# Patient Record
Sex: Male | Born: 1955 | Hispanic: No | Marital: Married | Smoking: Never smoker
Health system: Southern US, Community
[De-identification: ages and names within clinical notes are randomized; demographics above are authoritative.]

## PROBLEM LIST (undated history)

## (undated) DIAGNOSIS — E785 Hyperlipidemia, unspecified: Secondary | ICD-10-CM

## (undated) DIAGNOSIS — I10 Essential (primary) hypertension: Secondary | ICD-10-CM

## (undated) HISTORY — DX: Essential (primary) hypertension: I10

## (undated) HISTORY — DX: Hyperlipidemia, unspecified: E78.5

---

## 2002-01-01 HISTORY — PX: BACK SURGERY: SHX140

## 2002-02-22 ENCOUNTER — Emergency Department (HOSPITAL_COMMUNITY): Admission: EM | Admit: 2002-02-22 | Discharge: 2002-02-23 | Payer: Self-pay | Admitting: Emergency Medicine

## 2002-02-24 ENCOUNTER — Encounter: Payer: Self-pay | Admitting: Neurosurgery

## 2002-02-24 ENCOUNTER — Ambulatory Visit (HOSPITAL_COMMUNITY): Admission: RE | Admit: 2002-02-24 | Discharge: 2002-02-25 | Payer: Self-pay | Admitting: Neurosurgery

## 2006-11-21 ENCOUNTER — Ambulatory Visit: Payer: Self-pay | Admitting: Internal Medicine

## 2009-10-31 IMAGING — CT CT CHEST W/ CM
1 series · 16 of 32 positions shown, 20 images · non-contrast
Comparison: none

REASON FOR EXAM: abn chest xray
COMMENTS:

[Series 2: soft tissue · axial · 0.71mm/px · z∈[-668,-398]mm · 16 of 60 slices shown, 20 images]
[im 4/60  soft-tissue]
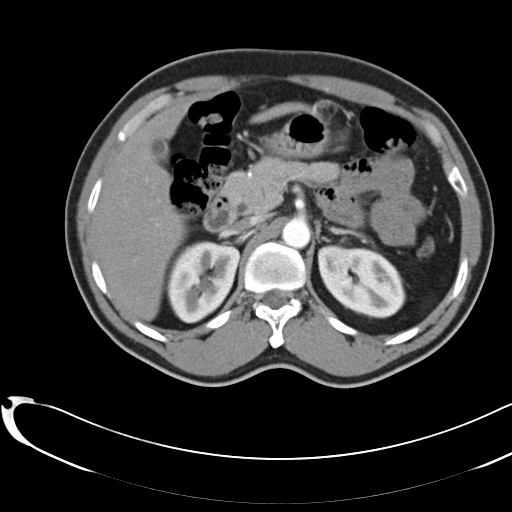
[im 4/60  bone]
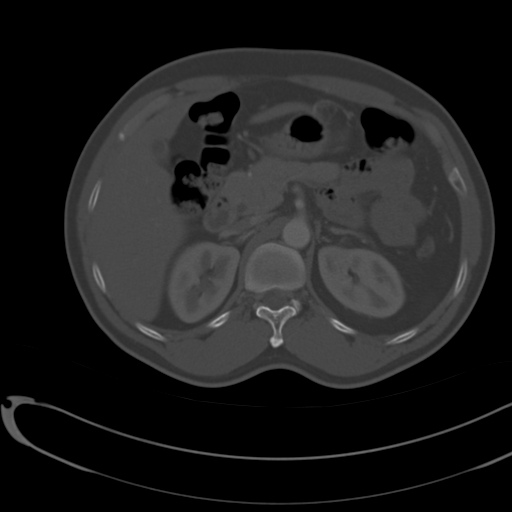
[im 8/60  soft-tissue]
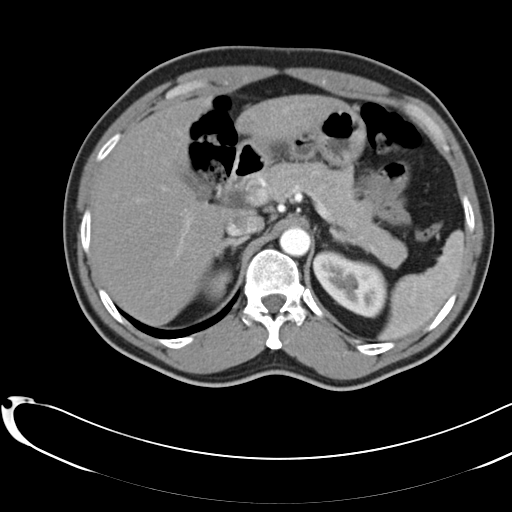
[im 12/60  soft-tissue]
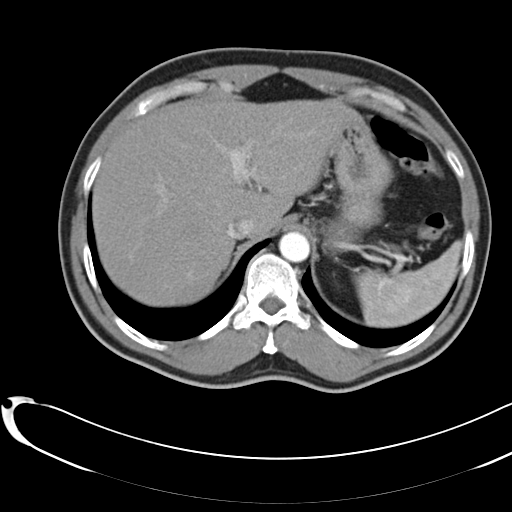
[im 16/60  soft-tissue]
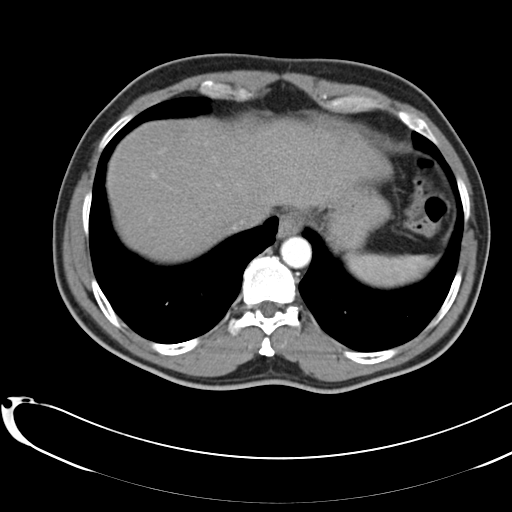
[im 20/60  soft-tissue]
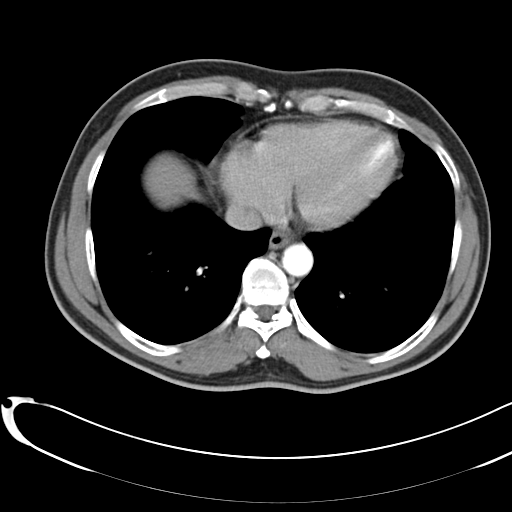
[im 23/60  soft-tissue]
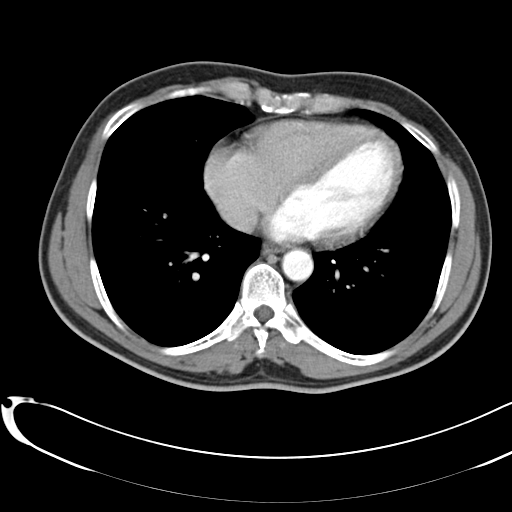
[im 27/60  soft-tissue]
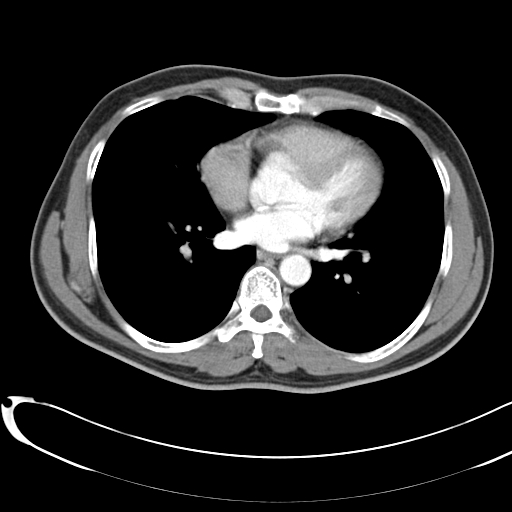
[im 33/60  soft-tissue]
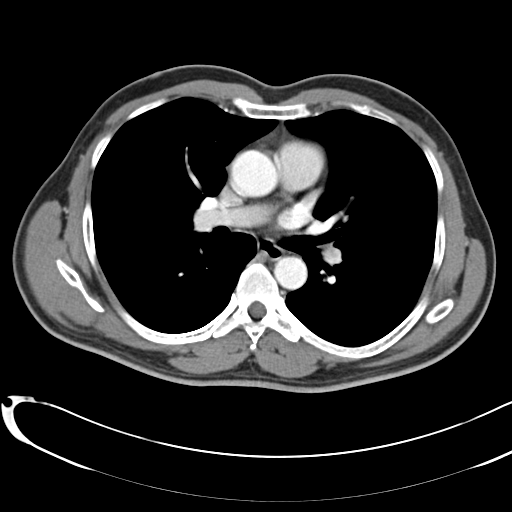
[im 37/60  soft-tissue]
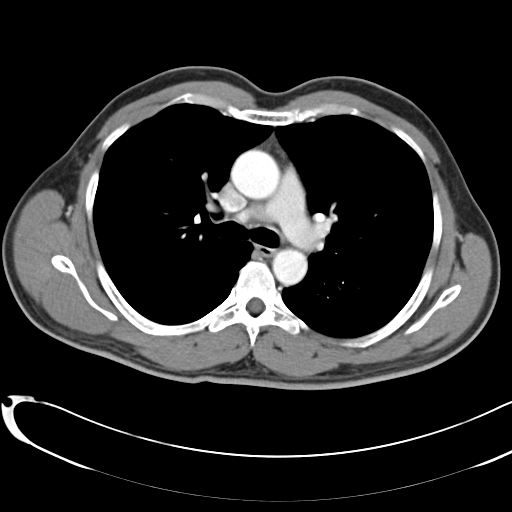
[im 37/60  bone]
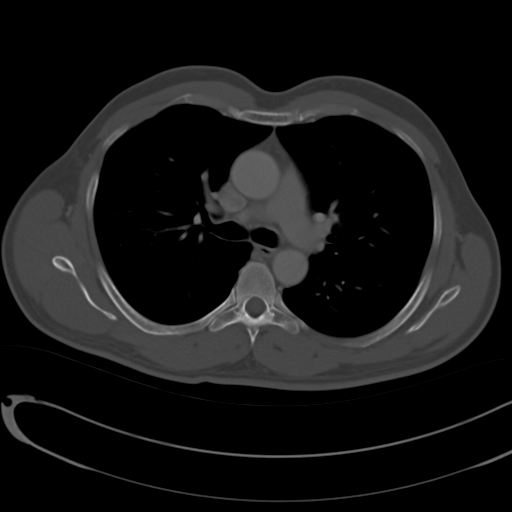
[im 40/60  soft-tissue]
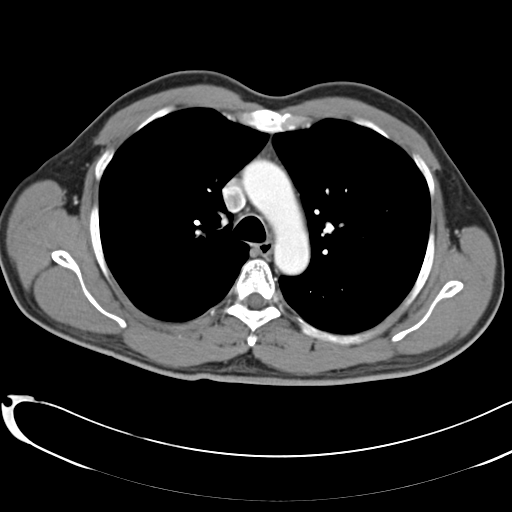
[im 44/60  soft-tissue]
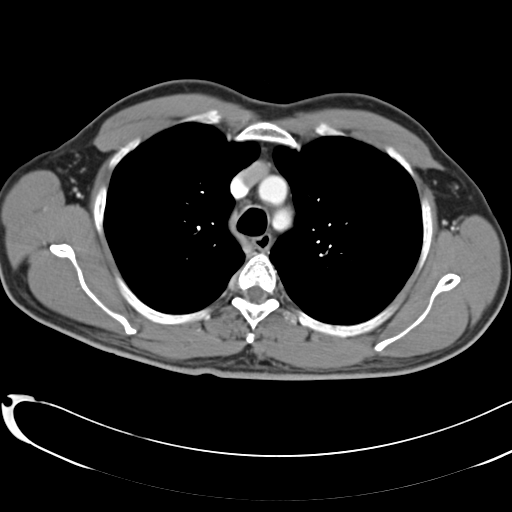
[im 48/60  soft-tissue]
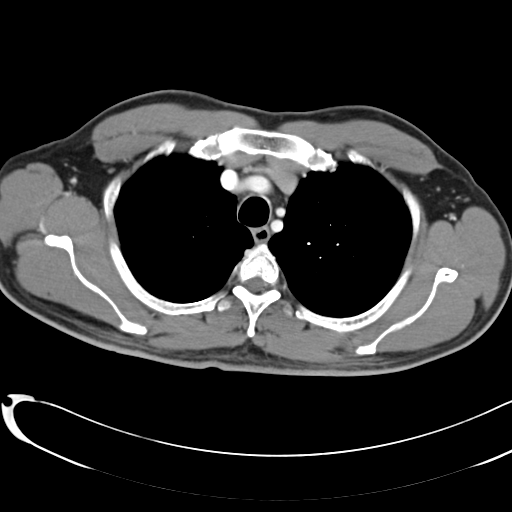
[im 52/60  soft-tissue]
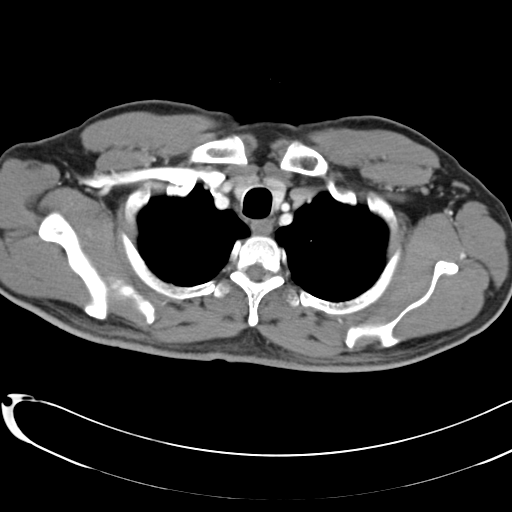
[im 52/60  lung]
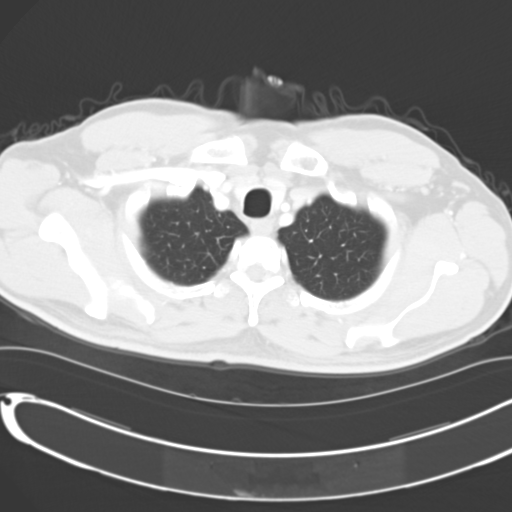
[im 54/60  lung]
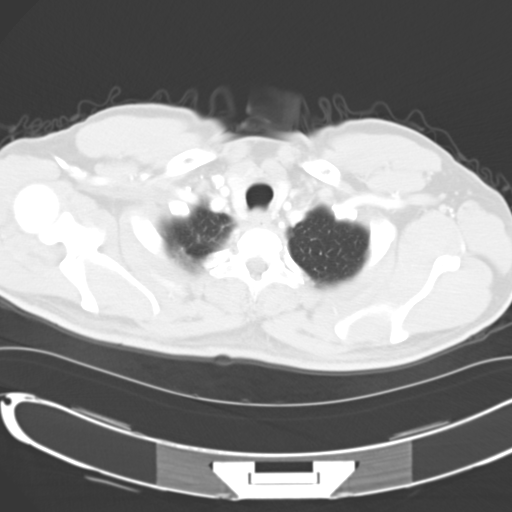
[im 56/60  soft-tissue]
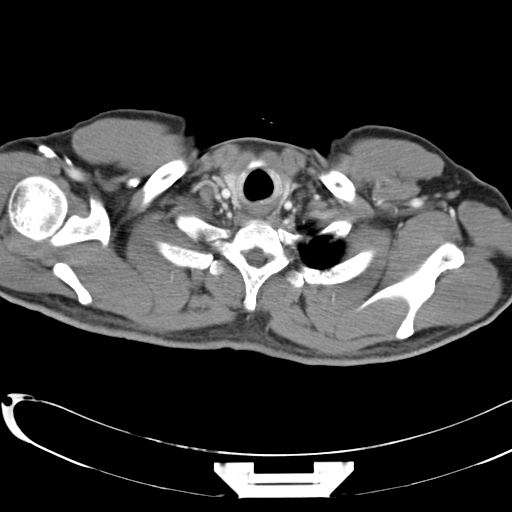
[im 56/60  lung]
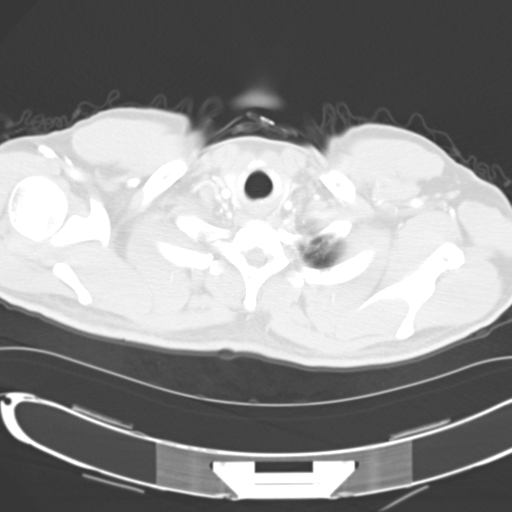
[im 58/60  lung]
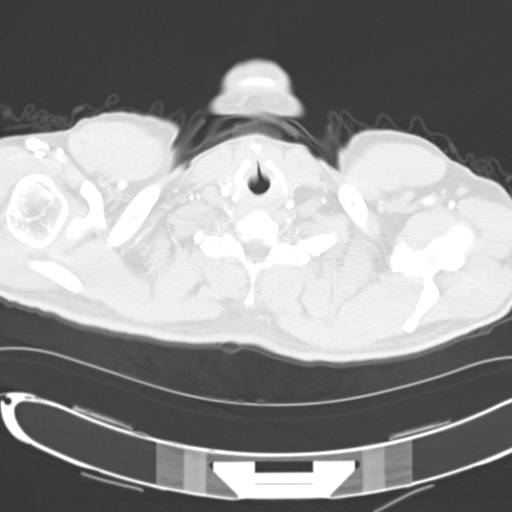

[16 of 32 positions shown; findings below may reference images not displayed]

PROCEDURE:     CT  - CT CHEST WITH CONTRAST  - November 21, 2006  [DATE]

RESULT:     Contrast-enhanced CT of the chest is performed utilizing 70 ml
of Wsovue-WJP iodinated contrast and a multislice helical acquisition
reconstructed at 5 mm slice thickness.

Lung window images demonstrate no focal infiltrate, mass or significant
emphysematous lung disease. There is no pulmonary edema, pleural effusion or
pericardial effusion. The thoracic aorta is normal in caliber with no
dissection evident. The heart appears to be normal in size. There is no
mediastinal, hilar or axillary mass or adenopathy. The upper abdominal
viscera included on this exam demonstrate no significant abnormality.
IMPRESSION: 1. No focal abnormality evident. No underlying pulmonary mass, adenopathy,
infiltrate or evidence of effusion.

## 2010-04-19 ENCOUNTER — Other Ambulatory Visit: Payer: Self-pay | Admitting: General Practice

## 2010-05-10 ENCOUNTER — Ambulatory Visit: Payer: Self-pay

## 2010-07-06 ENCOUNTER — Other Ambulatory Visit: Payer: Self-pay

## 2010-07-07 LAB — PSA: PSA: 1.2 ng/mL (ref 0.0–4.0)

## 2011-06-07 ENCOUNTER — Other Ambulatory Visit: Payer: Self-pay

## 2011-06-07 LAB — CBC WITH DIFFERENTIAL/PLATELET
Basophil #: 0 10*3/uL (ref 0.0–0.1)
Basophil %: 0.6 %
Eosinophil #: 0.1 10*3/uL (ref 0.0–0.7)
HGB: 15.5 g/dL (ref 13.0–18.0)
Lymphocyte #: 2.5 10*3/uL (ref 1.0–3.6)
Lymphocyte %: 36.6 %
MCH: 28.9 pg (ref 26.0–34.0)
MCV: 86 fL (ref 80–100)
Monocyte #: 0.7 x10 3/mm (ref 0.2–1.0)
Neutrophil %: 50.3 %
Platelet: 206 10*3/uL (ref 150–440)
RBC: 5.35 10*6/uL (ref 4.40–5.90)
RDW: 12.8 % (ref 11.5–14.5)
WBC: 6.9 10*3/uL (ref 3.8–10.6)

## 2011-06-07 LAB — LIPID PANEL
Cholesterol: 136 mg/dL (ref 0–200)
HDL Cholesterol: 32 mg/dL — ABNORMAL LOW (ref 40–60)
Ldl Cholesterol, Calc: 67 mg/dL (ref 0–100)
Triglycerides: 185 mg/dL (ref 0–200)
VLDL Cholesterol, Calc: 37 mg/dL (ref 5–40)

## 2011-06-07 LAB — COMPREHENSIVE METABOLIC PANEL
Albumin: 4.1 g/dL (ref 3.4–5.0)
Alkaline Phosphatase: 58 U/L (ref 50–136)
Calcium, Total: 8.9 mg/dL (ref 8.5–10.1)
Chloride: 104 mmol/L (ref 98–107)
Co2: 29 mmol/L (ref 21–32)
Creatinine: 1.07 mg/dL (ref 0.60–1.30)
EGFR (Non-African Amer.): >60
Osmolality: 279 (ref 275–301)
Potassium: 4.3 mmol/L (ref 3.5–5.1)
SGOT(AST): 47 U/L — ABNORMAL HIGH (ref 15–37)
SGPT (ALT): 45 U/L
Sodium: 139 mmol/L (ref 136–145)
Total Protein: 7.9 g/dL (ref 6.4–8.2)

## 2011-06-08 LAB — PSA: PSA: 1.2 ng/mL (ref 0.0–4.0)

## 2012-06-09 ENCOUNTER — Other Ambulatory Visit: Payer: Self-pay | Admitting: Physician Assistant

## 2012-06-09 LAB — COMPREHENSIVE METABOLIC PANEL
Albumin: 4.3 g/dL (ref 3.4–5.0)
Alkaline Phosphatase: 56 U/L (ref 50–136)
Anion Gap: 7 (ref 7–16)
BUN: 17 mg/dL (ref 7–18)
Bilirubin,Total: 0.5 mg/dL (ref 0.2–1.0)
Calcium, Total: 8.8 mg/dL (ref 8.5–10.1)
Chloride: 108 mmol/L — ABNORMAL HIGH (ref 98–107)
Co2: 25 mmol/L (ref 21–32)
Creatinine: 1.03 mg/dL (ref 0.60–1.30)
EGFR (African American): >60
EGFR (Non-African Amer.): >60
Glucose: 94 mg/dL (ref 65–99)
Osmolality: 281 (ref 275–301)
Potassium: 3.9 mmol/L (ref 3.5–5.1)
SGOT(AST): 43 U/L — ABNORMAL HIGH (ref 15–37)
SGPT (ALT): 40 U/L (ref 12–78)
Sodium: 140 mmol/L (ref 136–145)
Total Protein: 7.9 g/dL (ref 6.4–8.2)

## 2012-06-09 LAB — CBC WITH DIFFERENTIAL/PLATELET
Basophil #: 0 10*3/uL (ref 0.0–0.1)
Eosinophil #: 0.1 10*3/uL (ref 0.0–0.7)
HCT: 46.1 % (ref 40.0–52.0)
HGB: 16 g/dL (ref 13.0–18.0)
Lymphocyte #: 2.5 10*3/uL (ref 1.0–3.6)
Lymphocyte %: 38.2 %
MCH: 29.1 pg (ref 26.0–34.0)
MCHC: 34.6 g/dL (ref 32.0–36.0)
Neutrophil #: 3.2 10*3/uL (ref 1.4–6.5)
Platelet: 212 10*3/uL (ref 150–440)
RBC: 5.49 10*6/uL (ref 4.40–5.90)
RDW: 13 % (ref 11.5–14.5)

## 2012-06-09 LAB — LIPID PANEL
Cholesterol: 161 mg/dL (ref 0–200)
HDL Cholesterol: 43 mg/dL (ref 40–60)
Ldl Cholesterol, Calc: 88 mg/dL (ref 0–100)
Triglycerides: 152 mg/dL (ref 0–200)
VLDL Cholesterol, Calc: 30 mg/dL (ref 5–40)

## 2012-06-10 LAB — PSA: PSA: 1.3 ng/mL (ref 0.0–4.0)

## 2017-02-21 ENCOUNTER — Encounter: Payer: Self-pay | Admitting: Nurse Practitioner

## 2017-02-21 ENCOUNTER — Ambulatory Visit (INDEPENDENT_AMBULATORY_CARE_PROVIDER_SITE_OTHER): Payer: Managed Care, Other (non HMO) | Admitting: Nurse Practitioner

## 2017-02-21 VITALS — BP 132/88 | HR 91 | Resp 16 | Ht 69.0 in | Wt 150.0 lb

## 2017-02-21 DIAGNOSIS — Z0001 Encounter for general adult medical examination with abnormal findings: Secondary | ICD-10-CM | POA: Diagnosis not present

## 2017-02-21 DIAGNOSIS — M722 Plantar fascial fibromatosis: Secondary | ICD-10-CM

## 2017-02-21 DIAGNOSIS — E782 Mixed hyperlipidemia: Secondary | ICD-10-CM | POA: Insufficient documentation

## 2017-02-21 DIAGNOSIS — Z125 Encounter for screening for malignant neoplasm of prostate: Secondary | ICD-10-CM | POA: Diagnosis not present

## 2017-02-21 DIAGNOSIS — Z1211 Encounter for screening for malignant neoplasm of colon: Secondary | ICD-10-CM | POA: Diagnosis not present

## 2017-02-21 DIAGNOSIS — R300 Dysuria: Secondary | ICD-10-CM

## 2017-02-21 DIAGNOSIS — I10 Essential (primary) hypertension: Secondary | ICD-10-CM | POA: Diagnosis not present

## 2017-02-21 MED ORDER — IBUPROFEN 800 MG PO TABS: 800.0000 mg | ORAL_TABLET | Freq: Three times a day (TID) | ORAL | 1 refills | Status: DC | PRN

## 2017-02-21 MED ORDER — SIMVASTATIN 20 MG PO TABS: 20.0000 mg | ORAL_TABLET | Freq: Every day | ORAL | 11 refills | Status: DC

## 2017-02-21 MED ORDER — AMLODIPINE BESYLATE 5 MG PO TABS: 5.0000 mg | ORAL_TABLET | Freq: Every day | ORAL | 11 refills | Status: DC

## 2017-02-21 NOTE — Progress Notes (Signed)
[facility] [address] [place], [place] [id number]  Internal MEDICINE  Office Visit Note  Patient Name: Raymond Petersen  [id number]  [id number]  Date of Service: 03/02/2017  Chief Complaint  Patient presents with  . Foot Pain    left      Foot Pain  This is a new problem. The current episode started 1 to 4 weeks ago. The problem occurs intermittently. The problem has been gradually improving. Associated symptoms include arthralgias. Pertinent negatives include no abdominal pain, chest pain, chills, congestion, coughing, fatigue, joint swelling, nausea, neck pain, numbness, rash, sore throat or vomiting. The symptoms are aggravated by walking and standing. He has tried heat, ice, NSAIDs and rest for the symptoms. The treatment provided mild relief.   Pt is here for routine health maintenance examination  Current Medication: Outpatient Encounter Medications as of 02/21/2017  Medication Sig  . amLODipine (NORVASC) 5 MG tablet Take 1 tablet (5 mg total) by mouth daily.  Marland Kitchen. ibuprofen (ADVIL,MOTRIN) 800 MG tablet Take 1 tablet (800 mg total) by mouth every 8 (eight) hours as needed for moderate pain.  . simvastatin (ZOCOR) 20 MG tablet Take 1 tablet (20 mg total) by mouth daily at 6 PM.  . [DISCONTINUED] amLODipine (NORVASC) 5 MG tablet TK 1 T PO ONCE D  . [DISCONTINUED] simvastatin (ZOCOR) 20 MG tablet TK 1 T PO Q NIGHT FOR CHOLESTEROL   No facility-administered encounter medications on file as of 02/21/2017.     Surgical History: Past Surgical History:  Procedure Laterality Date  . BACK SURGERY  2004    Medical History: Past Medical History:  Diagnosis Date  . Hyperlipidemia   . Hypertension     Family History: Family History  Problem Relation Age of Onset  . Diabetes Mother       Review of Systems  Constitutional: Negative for activity change, chills, fatigue and unexpected weight change.  HENT: Negative for congestion, postnasal drip, rhinorrhea,  sneezing and sore throat.   Eyes: Negative.  Negative for redness.  Respiratory: Negative for apnea, cough, chest tightness and shortness of breath.   Cardiovascular: Negative for chest pain and palpitations.  Gastrointestinal: Negative for abdominal pain, constipation, diarrhea, nausea and vomiting.  Endocrine: Negative for cold intolerance, heat intolerance, polydipsia, polyphagia and polyuria.  Genitourinary: Negative.  Negative for dysuria and frequency.  Musculoskeletal: Positive for arthralgias. Negative for back pain, joint swelling and neck pain.       Left foot pain along the inner arch, just below the heel. Some swelling has been noted. Hurts more when standing up, then gradually improves. Has been taking OTC ibuprofen and using heat and ice. This has helped some as well.   Skin: Negative for rash.  Allergic/Immunologic: Negative for environmental allergies.  Neurological: Negative.  Negative for tremors and numbness.  Hematological: Negative for adenopathy. Does not bruise/bleed easily.  Psychiatric/Behavioral: Negative for agitation, behavioral problems (Depression), sleep disturbance and suicidal ideas. The patient is not nervous/anxious.      Today's Vitals   02/21/17 1034 02/21/17 1123  BP: (!) 150/94 132/88  Pulse: 91   Resp: 16   SpO2: 94%   Weight: 150 lb (68 kg)   Height: 5\' 9"  (1.753 m)     Physical Exam  Constitutional: He is oriented to person, place, and time. He appears well-developed and well-nourished. No distress.  HENT:  Head: Normocephalic and atraumatic.  Mouth/Throat: Oropharynx is clear and moist. No oropharyngeal exudate.  Eyes: EOM are normal. Pupils are  equal, round, and reactive to light.  Neck: Normal range of motion. Neck supple. No JVD present. No tracheal deviation present. No thyromegaly present.  Cardiovascular: Normal rate, regular rhythm and normal heart sounds. Exam reveals no gallop and no friction rub.  No murmur  heard. Pulmonary/Chest: Effort normal. No respiratory distress. He has no wheezes. He has no rales. He exhibits no tenderness.  Abdominal: Soft. Bowel sounds are normal.  Musculoskeletal: Normal range of motion.  Lymphadenopathy:    He has no cervical adenopathy.  Neurological: He is alert and oriented to person, place, and time. No cranial nerve deficit.  Skin: Skin is warm and dry. He is not diaphoretic.  Psychiatric: He has a normal mood and affect. His behavior is normal. Judgment and thought content normal.  Nursing note and vitals reviewed.   Assessment/Plan: 1. Encounter for general adult medical examination with abnormal findings Annual wellness visit today. - CBC with Differential/Platelet - Comprehensive metabolic panel - TSH  2. Essential hypertension, malignant Stable. Continue bp medication as prescribed. - CBC with Differential/Platelet - Comprehensive metabolic panel - amLODipine (NORVASC) 5 MG tablet; Take 1 tablet (5 mg total) by mouth daily.  Dispense: 30 tablet; Refill: 11  3. Mixed hyperlipidemia - Lipid panel - simvastatin (ZOCOR) 20 MG tablet; Take 1 tablet (20 mg total) by mouth daily at 6 PM.  Dispense: 30 tablet; Refill: 11  4. Plantar fasciitis - ibuprofen (ADVIL,MOTRIN) 800 MG tablet; Take 1 tablet (800 mg total) by mouth every 8 (eight) hours as needed for moderate pain.  Dispense: 90 tablet; Refill: 1 Discussed stretching exercises to help relieve plantar fasciitis. Will discuss refferral to podiatry if no improvement in reasonable time.   5. Dysuria - Urinalysis, Routine w reflex microscopic  6. Screening for prostate cancer - PSA  7. Screening for colon cancer - Fecal occult blood, imunochemical  General Counseling: [name] verbalizes understanding of the findings of todays visit and agrees with plan of treatment. I have discussed any further diagnostic evaluation that may be needed or ordered today. We also reviewed his medications today. he  has been encouraged to call the office with any questions or concerns that should arise related to todays visit.  This patient was seen by Vincent Gros, FNP- C in Collaboration with Dr Lyndon Code as a part of collaborative care agreement    Orders Placed This Encounter  Procedures  . Fecal occult blood, imunochemical  . Urinalysis, Routine w reflex microscopic  . PSA  . CBC with Differential/Platelet  . Comprehensive metabolic panel  . Lipid panel  . TSH    Meds ordered this encounter  Medications  . amLODipine (NORVASC) 5 MG tablet    Sig: Take 1 tablet (5 mg total) by mouth daily.    Dispense:  30 tablet    Refill:  11    Order Specific Question:   Supervising Provider    Answer:   Lyndon Code [[id number]]  . simvastatin (ZOCOR) 20 MG tablet    Sig: Take 1 tablet (20 mg total) by mouth daily at 6 PM.    Dispense:  30 tablet    Refill:  11    Order Specific Question:   Supervising Provider    Answer:   Lyndon Code [[id number]]  . ibuprofen (ADVIL,MOTRIN) 800 MG tablet    Sig: Take 1 tablet (800 mg total) by mouth every 8 (eight) hours as needed for moderate pain.    Dispense:  90 tablet    Refill:  1    Order Specific Question:   Supervising Provider    Answer:   Lyndon Code [[id number]]    Time spent: 13 Minutes    Lyndon Code, MD  Internal Medicine

## 2017-02-22 LAB — URINALYSIS, ROUTINE W REFLEX MICROSCOPIC
Bilirubin, UA: NEGATIVE
Glucose, UA: NEGATIVE
Ketones, UA: NEGATIVE
Leukocytes, UA: NEGATIVE
Nitrite, UA: NEGATIVE
PH UA: 7 (ref 5.0–7.5)
Protein, UA: NEGATIVE
RBC UA: NEGATIVE
Specific Gravity, UA: 1.02 (ref 1.005–1.030)
Urobilinogen, Ur: 0.2 mg/dL (ref 0.2–1.0)

## 2017-02-23 LAB — COMPREHENSIVE METABOLIC PANEL
ALT: 32 IU/L (ref 0–44)
AST: 29 IU/L (ref 0–40)
Albumin/Globulin Ratio: 1.7 (ref 1.2–2.2)
Albumin: 4.9 g/dL (ref 3.5–5.5)
Alkaline Phosphatase: 57 IU/L (ref 39–117)
BUN/Creatinine Ratio: 17 (ref 9–20)
BUN: 19 mg/dL (ref 6–24)
Bilirubin Total: 0.5 mg/dL (ref 0.0–1.2)
CHLORIDE: 103 mmol/L (ref 96–106)
CO2: 23 mmol/L (ref 20–29)
CREATININE: 1.13 mg/dL (ref 0.76–1.27)
Calcium: 9.7 mg/dL (ref 8.7–10.2)
GFR calc Af Amer: 82 mL/min/{1.73_m2} (ref >59)
GFR calc non Af Amer: 71 mL/min/{1.73_m2} (ref >59)
GLUCOSE: 102 mg/dL — AB (ref 65–99)
Globulin, Total: 2.9 g/dL (ref 1.5–4.5)
Potassium: 4.6 mmol/L (ref 3.5–5.2)
Sodium: 140 mmol/L (ref 134–144)
TOTAL PROTEIN: 7.8 g/dL (ref 6.0–8.5)

## 2017-02-23 LAB — CBC WITH DIFFERENTIAL/PLATELET
Basophils Absolute: 0 10*3/uL (ref 0.0–0.2)
Basos: 0 %
EOS (ABSOLUTE): 0.1 10*3/uL (ref 0.0–0.4)
Eos: 2 %
Hematocrit: 47.2 % (ref 37.5–51.0)
Hemoglobin: 16.5 g/dL (ref 13.0–17.7)
Immature Grans (Abs): 0 10*3/uL (ref 0.0–0.1)
Immature Granulocytes: 0 %
LYMPHS: 44 %
Lymphocytes Absolute: 3.3 10*3/uL — ABNORMAL HIGH (ref 0.7–3.1)
MCH: 29.8 pg (ref 26.6–33.0)
MCHC: 35 g/dL (ref 31.5–35.7)
MCV: 85 fL (ref 79–97)
MONOCYTES: 8 %
Monocytes Absolute: 0.6 10*3/uL (ref 0.1–0.9)
Neutrophils Absolute: 3.5 10*3/uL (ref 1.4–7.0)
Neutrophils: 46 %
PLATELETS: 223 10*3/uL (ref 150–379)
RBC: 5.54 x10E6/uL (ref 4.14–5.80)
RDW: 13.4 % (ref 12.3–15.4)
WBC: 7.6 10*3/uL (ref 3.4–10.8)

## 2017-02-23 LAB — LIPID PANEL
Chol/HDL Ratio: 3.7 ratio (ref 0.0–5.0)
Cholesterol, Total: 157 mg/dL (ref 100–199)
HDL: 42 mg/dL (ref >39)
LDL CALC: 82 mg/dL (ref 0–99)
Triglycerides: 167 mg/dL — ABNORMAL HIGH (ref 0–149)
VLDL CHOLESTEROL CAL: 33 mg/dL (ref 5–40)

## 2017-02-23 LAB — PSA: PROSTATE SPECIFIC AG, SERUM: 1 ng/mL (ref 0.0–4.0)

## 2017-02-23 LAB — TSH: TSH: 2.94 u[IU]/mL (ref 0.450–4.500)

## 2017-02-26 ENCOUNTER — Emergency Department: Payer: No Typology Code available for payment source

## 2017-02-26 ENCOUNTER — Emergency Department
Admission: EM | Admit: 2017-02-26 | Discharge: 2017-02-26 | Disposition: A | Discharge status: Home / Self Care | Payer: No Typology Code available for payment source | Attending: Emergency Medicine | Admitting: Emergency Medicine

## 2017-02-26 ENCOUNTER — Other Ambulatory Visit: Payer: Self-pay

## 2017-02-26 ENCOUNTER — Telehealth: Payer: Self-pay

## 2017-02-26 ENCOUNTER — Encounter: Payer: Self-pay | Admitting: Emergency Medicine

## 2017-02-26 DIAGNOSIS — Y9355 Activity, bike riding: Secondary | ICD-10-CM | POA: Diagnosis not present

## 2017-02-26 DIAGNOSIS — Y929 Unspecified place or not applicable: Secondary | ICD-10-CM | POA: Insufficient documentation

## 2017-02-26 DIAGNOSIS — S2242XA Multiple fractures of ribs, left side, initial encounter for closed fracture: Secondary | ICD-10-CM | POA: Diagnosis not present

## 2017-02-26 DIAGNOSIS — Y999 Unspecified external cause status: Secondary | ICD-10-CM | POA: Insufficient documentation

## 2017-02-26 DIAGNOSIS — I10 Essential (primary) hypertension: Secondary | ICD-10-CM | POA: Diagnosis not present

## 2017-02-26 DIAGNOSIS — S299XXA Unspecified injury of thorax, initial encounter: Secondary | ICD-10-CM | POA: Diagnosis present

## 2017-02-26 DIAGNOSIS — T1490XA Injury, unspecified, initial encounter: Secondary | ICD-10-CM

## 2017-02-26 MED ORDER — HYDROCODONE-ACETAMINOPHEN 5-325 MG PO TABS: 1.0000 | ORAL_TABLET | ORAL | 0 refills | Status: DC | PRN

## 2017-02-26 MED ORDER — MELOXICAM 15 MG PO TABS: 15.0000 mg | ORAL_TABLET | Freq: Every day | ORAL | 0 refills | Status: DC

## 2017-02-26 MED ORDER — OXYCODONE-ACETAMINOPHEN 5-325 MG PO TABS
1.0000 | ORAL_TABLET | Freq: Once | ORAL | Status: AC
  Administered 2017-02-26: 1 via ORAL
  Filled 2017-02-26: qty 1

## 2017-02-26 NOTE — Telephone Encounter (Signed)
Left vm that labs all good.  dbs
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
## 2017-03-02 DIAGNOSIS — R300 Dysuria: Secondary | ICD-10-CM | POA: Insufficient documentation

## 2017-03-02 DIAGNOSIS — M722 Plantar fascial fibromatosis: Secondary | ICD-10-CM | POA: Insufficient documentation

## 2017-03-04 ENCOUNTER — Telehealth: Payer: Self-pay

## 2017-03-04 ENCOUNTER — Ambulatory Visit: Payer: Managed Care, Other (non HMO) | Admitting: Nurse Practitioner
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
## 2018-01-13 ENCOUNTER — Other Ambulatory Visit: Payer: Self-pay

## 2018-01-13 DIAGNOSIS — E782 Mixed hyperlipidemia: Secondary | ICD-10-CM

## 2018-01-13 MED ORDER — SIMVASTATIN 20 MG PO TABS
20.0000 mg | ORAL_TABLET | Freq: Every day | ORAL | 2 refills | Status: DC
Start: 2018-01-13 — End: 2018-02-24

## 2018-02-24 ENCOUNTER — Ambulatory Visit: Payer: BLUE CROSS/BLUE SHIELD | Admitting: Nurse Practitioner

## 2018-02-24 ENCOUNTER — Other Ambulatory Visit: Payer: Self-pay | Admitting: Nurse Practitioner

## 2018-02-24 VITALS — BP 142/92 | HR 86 | Resp 16 | Ht 70.0 in | Wt 150.0 lb

## 2018-02-24 DIAGNOSIS — I10 Essential (primary) hypertension: Secondary | ICD-10-CM | POA: Diagnosis not present

## 2018-02-24 DIAGNOSIS — E782 Mixed hyperlipidemia: Secondary | ICD-10-CM | POA: Diagnosis not present

## 2018-02-24 DIAGNOSIS — R300 Dysuria: Secondary | ICD-10-CM | POA: Diagnosis not present

## 2018-02-24 DIAGNOSIS — Z0001 Encounter for general adult medical examination with abnormal findings: Secondary | ICD-10-CM

## 2018-02-24 MED ORDER — SIMVASTATIN 20 MG PO TABS: 20.0000 mg | ORAL_TABLET | Freq: Every day | ORAL | 11 refills | Status: DC

## 2018-02-24 MED ORDER — AMLODIPINE BESYLATE 5 MG PO TABS: 5.0000 mg | ORAL_TABLET | Freq: Every day | ORAL | 11 refills | Status: DC

## 2018-02-24 NOTE — Progress Notes (Signed)
[facility] [address] [place], [place] [address]  Internal MEDICINE  Office Visit Note  Patient Name: Raymond Petersen  [id number]  [id number]  Date of Service: 02/24/2018   Pt is here for routine health maintenance examination   Chief Complaint  Patient presents with  . Annual Exam  . Hypertension     Hypertension  This is a chronic problem. The current episode started more than 1 year ago. The problem is controlled. Pertinent negatives include no chest pain, headaches, neck pain, palpitations or shortness of breath. There are no associated agents to hypertension. Risk factors for coronary artery disease include dyslipidemia and male gender. Past treatments include calcium channel blockers. The current treatment provides moderate improvement. There are no compliance problems.     Current Medication: Outpatient Encounter Medications as of 02/24/2018  Medication Sig  . amLODipine (NORVASC) 5 MG tablet Take 1 tablet (5 mg total) by mouth daily.  Marland Kitchen HYDROcodone-acetaminophen (NORCO/VICODIN) 5-325 MG tablet Take 1 tablet by mouth every 4 (four) hours as needed for moderate pain.  Marland Kitchen ibuprofen (ADVIL,MOTRIN) 800 MG tablet Take 1 tablet (800 mg total) by mouth every 8 (eight) hours as needed for moderate pain.  . meloxicam (MOBIC) 15 MG tablet Take 1 tablet (15 mg total) by mouth daily.  . simvastatin (ZOCOR) 20 MG tablet Take 1 tablet (20 mg total) by mouth daily at 6 PM.  . [DISCONTINUED] amLODipine (NORVASC) 5 MG tablet Take 1 tablet (5 mg total) by mouth daily.  . [DISCONTINUED] simvastatin (ZOCOR) 20 MG tablet Take 1 tablet (20 mg total) by mouth daily at 6 PM.   No facility-administered encounter medications on file as of 02/24/2018.     Surgical History: Past Surgical History:  Procedure Laterality Date  . BACK SURGERY  2004    Medical History: Past Medical History:  Diagnosis Date  . Hyperlipidemia   . Hypertension     Family History: Family  History  Problem Relation Age of Onset  . Diabetes Mother       Review of Systems  Constitutional: Negative for activity change, chills, fatigue and unexpected weight change.  HENT: Negative for congestion, postnasal drip, rhinorrhea, sneezing and sore throat.   Respiratory: Negative for apnea, cough, chest tightness and shortness of breath.   Cardiovascular: Negative for chest pain and palpitations.  Gastrointestinal: Negative for abdominal pain, constipation, diarrhea, nausea and vomiting.  Endocrine: Negative for cold intolerance, heat intolerance, polydipsia and polyuria.  Genitourinary: Negative.   Musculoskeletal: Negative for back pain, joint swelling and neck pain.  Skin: Negative for rash.  Allergic/Immunologic: Negative for environmental allergies.  Neurological: Negative for dizziness, tremors, numbness and headaches.  Hematological: Negative for adenopathy. Does not bruise/bleed easily.  Psychiatric/Behavioral: Negative for agitation, behavioral problems (Depression), sleep disturbance and suicidal ideas. The patient is not nervous/anxious.      Today's Vitals   02/24/18 1337  BP: (!) 142/92  Pulse: 86  Resp: 16  SpO2: 99%  Weight: 150 lb (68 kg)  Height: 5\' 10"  (1.778 m)   Body mass index is 21.52 kg/m.  Physical Exam Vitals signs and nursing note reviewed.  Constitutional:      General: He is not in acute distress.    Appearance: Normal appearance. He is well-developed. He is not diaphoretic.  HENT:     Head: Normocephalic and atraumatic.     Nose: Nose normal.     Mouth/Throat:     Mouth: Mucous membranes are moist.     Pharynx:  Oropharynx is clear. No oropharyngeal exudate.  Eyes:     Extraocular Movements: Extraocular movements intact.     Conjunctiva/sclera: Conjunctivae normal.     Pupils: Pupils are equal, round, and reactive to light.  Neck:     Musculoskeletal: Normal range of motion and neck supple.     Thyroid: No thyromegaly.      Vascular: No carotid bruit or JVD.     Trachea: No tracheal deviation.  Cardiovascular:     Rate and Rhythm: Normal rate and regular rhythm.     Pulses: Normal pulses.     Heart sounds: Normal heart sounds. No murmur. No friction rub. No gallop.   Pulmonary:     Effort: Pulmonary effort is normal. No respiratory distress.     Breath sounds: Normal breath sounds. No wheezing or rales.  Chest:     Chest wall: No tenderness.  Abdominal:     General: Bowel sounds are normal.     Palpations: Abdomen is soft.  Musculoskeletal: Normal range of motion.  Lymphadenopathy:     Cervical: No cervical adenopathy.  Skin:    General: Skin is warm and dry.  Neurological:     Mental Status: He is alert and oriented to person, place, and time.     Cranial Nerves: No cranial nerve deficit.  Psychiatric:        Behavior: Behavior normal.        Thought Content: Thought content normal.        Judgment: Judgment normal.   Assessment/Plan: 1. Encounter for general adult medical examination with abnormal findings Annual health maintenance exam today.   2. Essential hypertension, malignant Stable. Continue blood pressure medication as precribed refills provided today.  - amLODipine (NORVASC) 5 MG tablet; Take 1 tablet (5 mg total) by mouth daily.  Dispense: 30 tablet; Refill: 11  3. Mixed hyperlipidemia Continue simvastatin as prescribed.  - simvastatin (ZOCOR) 20 MG tablet; Take 1 tablet (20 mg total) by mouth daily at 6 PM.  Dispense: 30 tablet; Refill: 11  4. Dysuria - UA/M w/rflx Culture, Routine  General Counseling: davion hoshino understanding of the findings of todays visit and agrees with plan of treatment. I have discussed any further diagnostic evaluation that may be needed or ordered today. We also reviewed his medications today. he has been encouraged to call the office with any questions or concerns that should arise related to todays visit.    Counseling:  Hypertension  Counseling:   The following hypertensive lifestyle modification were recommended and discussed:  1. Limiting alcohol intake to less than 1 oz/day of ethanol:(24 oz of beer or 8 oz of wine or 2 oz of 100-proof whiskey). 2. Take baby ASA 81 mg daily. 3. Importance of regular aerobic exercise and losing weight. 4. Reduce dietary saturated fat and cholesterol intake for overall cardiovascular health. 5. Maintaining adequate dietary potassium, calcium, and magnesium intake. 6. Regular monitoring of the blood pressure. 7. Reduce sodium intake to less than 100 mmol/day (less than 2.3 gm of sodium or less than 6 gm of sodium choride)   Diflucan may be taken as needed and as prescribed if yeast infection develops.  Orders Placed This Encounter  Procedures  . UA/M w/rflx Culture, Routine    Meds ordered this encounter  Medications  . amLODipine (NORVASC) 5 MG tablet    Sig: Take 1 tablet (5 mg total) by mouth daily.    Dispense:  30 tablet    Refill:  11  Order Specific Question:   Supervising Provider    Answer:   Lyndon Code [[id number]]  . simvastatin (ZOCOR) 20 MG tablet    Sig: Take 1 tablet (20 mg total) by mouth daily at 6 PM.    Dispense:  30 tablet    Refill:  11    Order Specific Question:   Supervising Provider    Answer:   Lyndon Code [[id number]]    Time spent: 91 Minutes      Lyndon Code, MD  Internal Medicine

## 2018-02-25 LAB — CBC
Hematocrit: 48.6 % (ref 37.5–51.0)
Hemoglobin: 16.7 g/dL (ref 13.0–17.7)
MCH: 29.7 pg (ref 26.6–33.0)
MCHC: 34.4 g/dL (ref 31.5–35.7)
MCV: 87 fL (ref 79–97)
Platelets: 245 10*3/uL (ref 150–450)
RBC: 5.62 x10E6/uL (ref 4.14–5.80)
RDW: 13.1 % (ref 11.6–15.4)
WBC: 6.8 10*3/uL (ref 3.4–10.8)

## 2018-02-25 LAB — UA/M W/RFLX CULTURE, ROUTINE
Bilirubin, UA: NEGATIVE
Glucose, UA: NEGATIVE
Ketones, UA: NEGATIVE
Leukocytes, UA: NEGATIVE
Nitrite, UA: NEGATIVE
PH UA: 7 (ref 5.0–7.5)
Protein, UA: NEGATIVE
RBC, UA: NEGATIVE
Specific Gravity, UA: 1.012 (ref 1.005–1.030)
Urobilinogen, Ur: 0.2 mg/dL (ref 0.2–1.0)

## 2018-02-25 LAB — COMPREHENSIVE METABOLIC PANEL
ALT: 34 IU/L (ref 0–44)
AST: 33 IU/L (ref 0–40)
Albumin/Globulin Ratio: 1.8 (ref 1.2–2.2)
Albumin: 5 g/dL — ABNORMAL HIGH (ref 3.8–4.9)
Alkaline Phosphatase: 58 IU/L (ref 39–117)
BUN / CREAT RATIO: 13 (ref 10–24)
BUN: 14 mg/dL (ref 8–27)
Bilirubin Total: 0.4 mg/dL (ref 0.0–1.2)
CO2: 23 mmol/L (ref 20–29)
Calcium: 9.5 mg/dL (ref 8.6–10.2)
Chloride: 101 mmol/L (ref 96–106)
Creatinine, Ser: 1.05 mg/dL (ref 0.76–1.27)
GFR calc Af Amer: 89 mL/min/{1.73_m2} (ref >59)
GFR calc non Af Amer: 77 mL/min/{1.73_m2} (ref >59)
Globulin, Total: 2.8 g/dL (ref 1.5–4.5)
Glucose: 104 mg/dL — ABNORMAL HIGH (ref 65–99)
Potassium: 4.2 mmol/L (ref 3.5–5.2)
SODIUM: 140 mmol/L (ref 134–144)
Total Protein: 7.8 g/dL (ref 6.0–8.5)

## 2018-02-25 LAB — TSH: TSH: 3.25 u[IU]/mL (ref 0.450–4.500)

## 2018-02-25 LAB — MICROSCOPIC EXAMINATION
Bacteria, UA: NONE SEEN
CASTS: NONE SEEN /LPF
Epithelial Cells (non renal): NONE SEEN /hpf (ref 0–10)
RBC MICROSCOPIC, UA: NONE SEEN /HPF (ref 0–2)

## 2018-02-25 LAB — LIPID PANEL W/O CHOL/HDL RATIO
Cholesterol, Total: 156 mg/dL (ref 100–199)
HDL: 38 mg/dL — ABNORMAL LOW (ref >39)
LDL Calculated: 79 mg/dL (ref 0–99)
Triglycerides: 195 mg/dL — ABNORMAL HIGH (ref 0–149)
VLDL Cholesterol Cal: 39 mg/dL (ref 5–40)

## 2018-02-25 LAB — T4, FREE: Free T4: 1.1 ng/dL (ref 0.82–1.77)

## 2018-02-25 LAB — PSA: Prostate Specific Ag, Serum: 1.6 ng/mL (ref 0.0–4.0)

## 2018-04-08 ENCOUNTER — Encounter: Payer: Self-pay | Admitting: Nurse Practitioner

## 2018-11-24 ENCOUNTER — Other Ambulatory Visit: Payer: Self-pay

## 2018-11-24 DIAGNOSIS — Z20822 Contact with and (suspected) exposure to covid-19: Secondary | ICD-10-CM

## 2018-11-25 LAB — NOVEL CORONAVIRUS, NAA: SARS-CoV-2, NAA: NOT DETECTED

## 2019-02-24 ENCOUNTER — Telehealth: Payer: Self-pay

## 2019-02-24 NOTE — Telephone Encounter (Signed)
Confirmed appointment on 02/26/2019 and screened for covid. klh 

## 2019-02-26 ENCOUNTER — Ambulatory Visit (INDEPENDENT_AMBULATORY_CARE_PROVIDER_SITE_OTHER): Payer: 59 | Admitting: Adult Health

## 2019-02-26 ENCOUNTER — Encounter: Payer: Self-pay | Admitting: Adult Health

## 2019-02-26 ENCOUNTER — Other Ambulatory Visit: Payer: Self-pay

## 2019-02-26 VITALS — BP 122/88 | HR 103 | Temp 98.0°F | Resp 16 | Ht 69.0 in | Wt 142.2 lb

## 2019-02-26 DIAGNOSIS — E782 Mixed hyperlipidemia: Secondary | ICD-10-CM | POA: Diagnosis not present

## 2019-02-26 DIAGNOSIS — Z0001 Encounter for general adult medical examination with abnormal findings: Secondary | ICD-10-CM

## 2019-02-26 DIAGNOSIS — R300 Dysuria: Secondary | ICD-10-CM

## 2019-02-26 DIAGNOSIS — Z125 Encounter for screening for malignant neoplasm of prostate: Secondary | ICD-10-CM | POA: Diagnosis not present

## 2019-02-26 DIAGNOSIS — I10 Essential (primary) hypertension: Secondary | ICD-10-CM | POA: Diagnosis not present

## 2019-02-26 NOTE — Progress Notes (Signed)
[facility] [address] [place], [place] [address]  Internal MEDICINE  Office Visit Note  Patient Name: Raymond Petersen  [id number]  [id number]  Date of Service: 02/26/2019  Chief Complaint  Patient presents with  . Annual Exam  . Hypertension  . Hyperlipidemia  . Quality Metric Gaps    colonoscopy     HPI Pt is here for routine health maintenance examination.  He is a well appearing 64 yo man.  He has a history of HTN, and HLD. His BP is slightly elevated initially. Denies Chest pain, Shortness of breath, palpitations, headache, or blurred vision. Recheck BP is 122/88.     Current Medication: Outpatient Encounter Medications as of 02/26/2019  Medication Sig  . amLODipine (NORVASC) 5 MG tablet Take 1 tablet (5 mg total) by mouth daily.  . simvastatin (ZOCOR) 20 MG tablet Take 1 tablet (20 mg total) by mouth daily at 6 PM.  . [DISCONTINUED] HYDROcodone-acetaminophen (NORCO/VICODIN) 5-325 MG tablet Take 1 tablet by mouth every 4 (four) hours as needed for moderate pain. (Patient not taking: Reported on 02/26/2019)  . [DISCONTINUED] ibuprofen (ADVIL,MOTRIN) 800 MG tablet Take 1 tablet (800 mg total) by mouth every 8 (eight) hours as needed for moderate pain. (Patient not taking: Reported on 02/26/2019)  . [DISCONTINUED] meloxicam (MOBIC) 15 MG tablet Take 1 tablet (15 mg total) by mouth daily. (Patient not taking: Reported on 02/26/2019)   No facility-administered encounter medications on file as of 02/26/2019.    Surgical History: Past Surgical History:  Procedure Laterality Date  . BACK SURGERY  2004    Medical History: Past Medical History:  Diagnosis Date  . Hyperlipidemia   . Hypertension     Family History: Family History  Problem Relation Age of Onset  . Diabetes Mother       Review of Systems  Constitutional: Negative.  Negative for chills, fatigue and unexpected weight change.  HENT: Negative.  Negative for congestion, rhinorrhea,  sneezing and sore throat.   Eyes: Negative for redness.  Respiratory: Negative.  Negative for cough, chest tightness and shortness of breath.   Cardiovascular: Negative.  Negative for chest pain and palpitations.  Gastrointestinal: Negative.  Negative for abdominal pain, constipation, diarrhea, nausea and vomiting.  Endocrine: Negative.   Genitourinary: Negative.  Negative for dysuria and frequency.  Musculoskeletal: Negative.  Negative for arthralgias, back pain, joint swelling and neck pain.  Skin: Negative.  Negative for rash.  Allergic/Immunologic: Negative.   Neurological: Negative.  Negative for tremors and numbness.  Hematological: Negative for adenopathy. Does not bruise/bleed easily.  Psychiatric/Behavioral: Negative.  Negative for behavioral problems, sleep disturbance and suicidal ideas. The patient is not nervous/anxious.      Vital Signs: BP 122/88   Pulse (!) 103   Temp 98 F (36.7 C)   Resp 16   Ht 5\' 9"  (1.753 m)   Wt 142 lb 3.2 oz (64.5 kg)   SpO2 97%   BMI 21.00 kg/m    Physical Exam Vitals and nursing note reviewed.  Constitutional:      General: He is not in acute distress.    Appearance: He is well-developed. He is not diaphoretic.  HENT:     Head: Normocephalic and atraumatic.     Mouth/Throat:     Pharynx: No oropharyngeal exudate.  Eyes:     Pupils: Pupils are equal, round, and reactive to light.  Neck:     Thyroid: No thyromegaly.     Vascular: No JVD.  Trachea: No tracheal deviation.  Cardiovascular:     Rate and Rhythm: Normal rate and regular rhythm.     Heart sounds: Normal heart sounds. No murmur. No friction rub. No gallop.   Pulmonary:     Effort: Pulmonary effort is normal. No respiratory distress.     Breath sounds: Normal breath sounds. No wheezing or rales.  Chest:     Chest wall: No tenderness.  Abdominal:     Palpations: Abdomen is soft.     Tenderness: There is no abdominal tenderness. There is no guarding.   Musculoskeletal:        General: Normal range of motion.     Cervical back: Normal range of motion and neck supple.  Lymphadenopathy:     Cervical: No cervical adenopathy.  Skin:    General: Skin is warm and dry.  Neurological:     Mental Status: He is alert and oriented to person, place, and time.     Cranial Nerves: No cranial nerve deficit.  Psychiatric:        Behavior: Behavior normal.        Thought Content: Thought content normal.        Judgment: Judgment normal.      LABS: No results found for this or any previous visit (from the past 2160 hour(s)).  Assessment/Plan: 1. Encounter for general adult medical examination with abnormal findings Will get labs, refused Colonoscopy or cologuard at this time.  - CBC with Differential/Platelet - Lipid Panel With LDL/HDL Ratio - TSH - T4, free - Comprehensive metabolic panel  2. Screening for malignant neoplasm of prostate - PSA  3. Essential hypertension, malignant Controlled currently.   4. Mixed hyperlipidemia Recheck Lipid panel  5. Dysuria - UA/M w/rflx Culture, Routine  General Counseling: kendrell lottman understanding of the findings of todays visit and agrees with plan of treatment. I have discussed any further diagnostic evaluation that may be needed or ordered today. We also reviewed his medications today. he has been encouraged to call the office with any questions or concerns that should arise related to todays visit.   Orders Placed This Encounter  Procedures  . UA/M w/rflx Culture, Routine  . CBC with Differential/Platelet  . Lipid Panel With LDL/HDL Ratio  . TSH  . T4, free  . Comprehensive metabolic panel  . PSA    No orders of the defined types were placed in this encounter.   Time spent: 25 Minutes   This patient was seen by Orson Gear AGNP-C in Collaboration with Dr Lavera Guise as a part of collaborative care agreement    Kendell Bane AGNP-C Internal Medicine

## 2019-02-27 LAB — UA/M W/RFLX CULTURE, ROUTINE
Bilirubin, UA: NEGATIVE
Glucose, UA: NEGATIVE
Leukocytes,UA: NEGATIVE
Nitrite, UA: NEGATIVE
Protein,UA: NEGATIVE
RBC, UA: NEGATIVE
Specific Gravity, UA: 1.025 (ref 1.005–1.030)
Urobilinogen, Ur: 0.2 mg/dL (ref 0.2–1.0)
pH, UA: 6.5 (ref 5.0–7.5)

## 2019-02-27 LAB — MICROSCOPIC EXAMINATION
Bacteria, UA: NONE SEEN
Casts: NONE SEEN /lpf
Epithelial Cells (non renal): NONE SEEN /hpf (ref 0–10)

## 2019-02-28 LAB — CBC WITH DIFFERENTIAL/PLATELET
Basophils Absolute: 0 10*3/uL (ref 0.0–0.2)
Basos: 0 %
EOS (ABSOLUTE): 0.1 10*3/uL (ref 0.0–0.4)
Eos: 1 %
Hematocrit: 47.9 % (ref 37.5–51.0)
Hemoglobin: 16.2 g/dL (ref 13.0–17.7)
Immature Grans (Abs): 0 10*3/uL (ref 0.0–0.1)
Immature Granulocytes: 0 %
Lymphocytes Absolute: 3.1 10*3/uL (ref 0.7–3.1)
Lymphs: 46 %
MCH: 29.1 pg (ref 26.6–33.0)
MCHC: 33.8 g/dL (ref 31.5–35.7)
MCV: 86 fL (ref 79–97)
Monocytes Absolute: 0.9 10*3/uL (ref 0.1–0.9)
Monocytes: 13 %
Neutrophils Absolute: 2.7 10*3/uL (ref 1.4–7.0)
Neutrophils: 40 %
Platelets: 244 10*3/uL (ref 150–450)
RBC: 5.56 x10E6/uL (ref 4.14–5.80)
RDW: 12.8 % (ref 11.6–15.4)
WBC: 6.8 10*3/uL (ref 3.4–10.8)

## 2019-02-28 LAB — LIPID PANEL WITH LDL/HDL RATIO
Cholesterol, Total: 141 mg/dL (ref 100–199)
HDL: 40 mg/dL (ref >39)
LDL Chol Calc (NIH): 79 mg/dL (ref 0–99)
LDL/HDL Ratio: 2 ratio (ref 0.0–3.6)
Triglycerides: 120 mg/dL (ref 0–149)
VLDL Cholesterol Cal: 22 mg/dL (ref 5–40)

## 2019-02-28 LAB — COMPREHENSIVE METABOLIC PANEL
ALT: 32 IU/L (ref 0–44)
AST: 40 IU/L (ref 0–40)
Albumin/Globulin Ratio: 1.7 (ref 1.2–2.2)
Albumin: 4.8 g/dL (ref 3.8–4.8)
Alkaline Phosphatase: 63 IU/L (ref 39–117)
BUN/Creatinine Ratio: 14 (ref 10–24)
BUN: 15 mg/dL (ref 8–27)
Bilirubin Total: 0.6 mg/dL (ref 0.0–1.2)
CO2: 23 mmol/L (ref 20–29)
Calcium: 9.6 mg/dL (ref 8.6–10.2)
Chloride: 99 mmol/L (ref 96–106)
Creatinine, Ser: 1.07 mg/dL (ref 0.76–1.27)
GFR calc Af Amer: 86 mL/min/{1.73_m2} (ref >59)
GFR calc non Af Amer: 75 mL/min/{1.73_m2} (ref >59)
Globulin, Total: 2.9 g/dL (ref 1.5–4.5)
Glucose: 99 mg/dL (ref 65–99)
Potassium: 3.9 mmol/L (ref 3.5–5.2)
Sodium: 138 mmol/L (ref 134–144)
Total Protein: 7.7 g/dL (ref 6.0–8.5)

## 2019-02-28 LAB — T4, FREE: Free T4: 1.21 ng/dL (ref 0.82–1.77)

## 2019-02-28 LAB — PSA: Prostate Specific Ag, Serum: 1.2 ng/mL (ref 0.0–4.0)

## 2019-02-28 LAB — TSH: TSH: 2.84 u[IU]/mL (ref 0.450–4.500)

## 2019-03-19 ENCOUNTER — Telehealth: Payer: Self-pay

## 2019-03-19 NOTE — Telephone Encounter (Signed)
Confirmed appointment on 03/23/2019 and screened for covid. klh 

## 2019-03-19 NOTE — Telephone Encounter (Signed)
Called lmom informing patient of appointment on 03/23/2019. klh 

## 2019-03-23 ENCOUNTER — Ambulatory Visit (INDEPENDENT_AMBULATORY_CARE_PROVIDER_SITE_OTHER): Payer: 59 | Admitting: Adult Health

## 2019-03-23 ENCOUNTER — Encounter: Payer: Self-pay | Admitting: Adult Health

## 2019-03-23 ENCOUNTER — Other Ambulatory Visit: Payer: Self-pay

## 2019-03-23 VITALS — BP 145/88 | HR 94 | Temp 97.8°F | Resp 16 | Ht 69.0 in | Wt 143.2 lb

## 2019-03-23 DIAGNOSIS — I10 Essential (primary) hypertension: Secondary | ICD-10-CM | POA: Diagnosis not present

## 2019-03-23 DIAGNOSIS — E782 Mixed hyperlipidemia: Secondary | ICD-10-CM

## 2019-03-23 NOTE — Progress Notes (Signed)
[facility] [address] [place], [place] [address]  Internal MEDICINE  Office Visit Note  Patient Name: Raymond Petersen  [id number]  [id number]  Date of Service: 03/23/2019  Chief Complaint  Patient presents with  . Follow-up    review labs  . Hypertension  . Hyperlipidemia    HPI Patient is here for routine follow-up. Reviewing his lab work everything is in normal limits. Lipid panel is normal, currently taking simvastatin each night. Blood pressure today is stable, no complaints of headaches, dizziness or visual disturbances. Denies chest pain, shortness of breath of palpitations.    Current Medication: Outpatient Encounter Medications as of 03/23/2019  Medication Sig  . amLODipine (NORVASC) 5 MG tablet Take 1 tablet (5 mg total) by mouth daily.  . simvastatin (ZOCOR) 20 MG tablet Take 1 tablet (20 mg total) by mouth daily at 6 PM.   No facility-administered encounter medications on file as of 03/23/2019.    Surgical History: Past Surgical History:  Procedure Laterality Date  . BACK SURGERY  2004    Medical History: Past Medical History:  Diagnosis Date  . Hyperlipidemia   . Hypertension     Family History: Family History  Problem Relation Age of Onset  . Diabetes Mother     Social History   Socioeconomic History  . Marital status: Married    Spouse name: Not on file  . Number of children: Not on file  . Years of education: Not on file  . Highest education level: Not on file  Occupational History  . Not on file  Tobacco Use  . Smoking status: Never Smoker  . Smokeless tobacco: Never Used  Substance and Sexual Activity  . Alcohol use: No  . Drug use: No  . Sexual activity: Not on file  Other Topics Concern  . Not on file  Social History Narrative  . Not on file   Social Determinants of Health   Financial Resource Strain:   . Difficulty of Paying Living Expenses:   Food Insecurity:   . Worried About Programme researcher, broadcasting/film/video in  the Last Year:   . Barista in the Last Year:   Transportation Needs:   . Freight forwarder (Medical):   Marland Kitchen Lack of Transportation (Non-Medical):   Physical Activity:   . Days of Exercise per Week:   . Minutes of Exercise per Session:   Stress:   . Feeling of Stress :   Social Connections:   . Frequency of Communication with Friends and Family:   . Frequency of Social Gatherings with Friends and Family:   . Attends Religious Services:   . Active Member of Clubs or Organizations:   . Attends Banker Meetings:   Marland Kitchen Marital Status:   Intimate Partner Violence:   . Fear of Current or Ex-Partner:   . Emotionally Abused:   Marland Kitchen Physically Abused:   . Sexually Abused:       Review of Systems  Constitutional: Negative.  Negative for chills, fatigue and unexpected weight change.  HENT: Negative.  Negative for congestion, rhinorrhea, sneezing and sore throat.   Eyes: Negative for redness.  Respiratory: Negative.  Negative for cough, chest tightness and shortness of breath.   Cardiovascular: Negative.  Negative for chest pain and palpitations.  Gastrointestinal: Negative.  Negative for abdominal pain, constipation, diarrhea, nausea and vomiting.  Endocrine: Negative.   Genitourinary: Negative.  Negative for dysuria and frequency.  Musculoskeletal: Negative.  Negative for arthralgias, back  pain, joint swelling and neck pain.  Skin: Negative.  Negative for rash.  Allergic/Immunologic: Negative.   Neurological: Negative.  Negative for tremors and numbness.  Hematological: Negative for adenopathy. Does not bruise/bleed easily.  Psychiatric/Behavioral: Negative.  Negative for behavioral problems, sleep disturbance and suicidal ideas. The patient is not nervous/anxious.     Vital Signs: BP (!) 145/88   Pulse 94   Temp 97.8 F (36.6 C)   Resp 16   Ht 5\' 9"  (1.753 m)   Wt 143 lb 3.2 oz (65 kg)   SpO2 99%   BMI 21.15 kg/m    Physical Exam Vitals and nursing  note reviewed.  Constitutional:      General: He is not in acute distress.    Appearance: He is well-developed. He is not diaphoretic.  HENT:     Head: Normocephalic and atraumatic.     Mouth/Throat:     Pharynx: No oropharyngeal exudate.  Eyes:     Pupils: Pupils are equal, round, and reactive to light.  Neck:     Thyroid: No thyromegaly.     Vascular: No JVD.     Trachea: No tracheal deviation.  Cardiovascular:     Rate and Rhythm: Normal rate and regular rhythm.     Heart sounds: Normal heart sounds. No murmur. No friction rub. No gallop.   Pulmonary:     Effort: Pulmonary effort is normal. No respiratory distress.     Breath sounds: Normal breath sounds. No wheezing or rales.  Chest:     Chest wall: No tenderness.  Abdominal:     Palpations: Abdomen is soft.     Tenderness: There is no abdominal tenderness. There is no guarding.  Musculoskeletal:        General: Normal range of motion.     Cervical back: Normal range of motion and neck supple.  Lymphadenopathy:     Cervical: No cervical adenopathy.  Skin:    General: Skin is warm and dry.  Neurological:     Mental Status: He is alert and oriented to person, place, and time.     Cranial Nerves: No cranial nerve deficit.  Psychiatric:        Behavior: Behavior normal.        Thought Content: Thought content normal.        Judgment: Judgment normal.    Assessment/Plan: 1. Essential hypertension, malignant Stable at this time on current therapy, continue to monitor  2. Mixed hyperlipidemia Stable at this time on current therapy, continue to monitor.  General Counseling: wenzel backlund understanding of the findings of todays visit and agrees with plan of treatment. I have discussed any further diagnostic evaluation that may be needed or ordered today. We also reviewed his medications today. he has been encouraged to call the office with any questions or concerns that should arise related to todays visit.    No  orders of the defined types were placed in this encounter.   No orders of the defined types were placed in this encounter.   Time spent: 20 Minutes   This patient was seen by Orson Gear AGNP-C in Collaboration with Dr Lavera Guise as a part of collaborative care agreement     Kendell Bane AGNP-C Internal medicine

## 2019-05-04 ENCOUNTER — Other Ambulatory Visit: Payer: Self-pay

## 2019-05-04 DIAGNOSIS — I10 Essential (primary) hypertension: Secondary | ICD-10-CM

## 2019-05-04 DIAGNOSIS — E782 Mixed hyperlipidemia: Secondary | ICD-10-CM

## 2019-05-04 MED ORDER — AMLODIPINE BESYLATE 5 MG PO TABS: 5.0000 mg | ORAL_TABLET | Freq: Every day | ORAL | 5 refills | Status: DC

## 2019-05-04 MED ORDER — SIMVASTATIN 20 MG PO TABS: 20.0000 mg | ORAL_TABLET | Freq: Every day | ORAL | 5 refills | Status: DC

## 2019-11-02 ENCOUNTER — Other Ambulatory Visit: Payer: Self-pay

## 2019-11-02 DIAGNOSIS — E782 Mixed hyperlipidemia: Secondary | ICD-10-CM

## 2019-11-02 DIAGNOSIS — I10 Essential (primary) hypertension: Secondary | ICD-10-CM

## 2019-11-02 MED ORDER — AMLODIPINE BESYLATE 5 MG PO TABS: 5.0000 mg | ORAL_TABLET | Freq: Every day | ORAL | 5 refills | Status: DC

## 2019-11-02 MED ORDER — SIMVASTATIN 20 MG PO TABS: 20.0000 mg | ORAL_TABLET | Freq: Every day | ORAL | 5 refills | Status: DC
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
## 2020-03-01 ENCOUNTER — Ambulatory Visit (INDEPENDENT_AMBULATORY_CARE_PROVIDER_SITE_OTHER): Payer: BLUE CROSS/BLUE SHIELD | Admitting: Hospice and Palliative Medicine

## 2020-03-01 ENCOUNTER — Encounter: Payer: Self-pay | Admitting: Hospice and Palliative Medicine

## 2020-03-01 VITALS — BP 140/80 | HR 94 | Temp 97.8°F | Resp 16 | Ht 69.0 in | Wt 145.4 lb

## 2020-03-01 DIAGNOSIS — E782 Mixed hyperlipidemia: Secondary | ICD-10-CM

## 2020-03-01 DIAGNOSIS — Z125 Encounter for screening for malignant neoplasm of prostate: Secondary | ICD-10-CM

## 2020-03-01 DIAGNOSIS — I10 Essential (primary) hypertension: Secondary | ICD-10-CM | POA: Diagnosis not present

## 2020-03-01 DIAGNOSIS — R5383 Other fatigue: Secondary | ICD-10-CM

## 2020-03-01 DIAGNOSIS — Z0001 Encounter for general adult medical examination with abnormal findings: Secondary | ICD-10-CM | POA: Diagnosis not present

## 2020-03-01 MED ORDER — SIMVASTATIN 20 MG PO TABS: 20.0000 mg | ORAL_TABLET | Freq: Every day | ORAL | 1 refills | Status: DC

## 2020-03-01 MED ORDER — AMLODIPINE BESYLATE 5 MG PO TABS: 5.0000 mg | ORAL_TABLET | Freq: Every day | ORAL | 1 refills | Status: DC

## 2020-03-01 NOTE — Progress Notes (Signed)
[facility] [address] [place], [place] [address]  Internal MEDICINE  Office Visit Note  Patient Name: Raymond Petersen  [id number]  [id number]  Date of Service: 03/02/2020  Chief Complaint  Patient presents with  . Annual Exam    Has second covid shot and booster  . Hyperlipidemia  . Hypertension  . Quality Metric Gaps    Colonoscopy, flu     HPI Pt is here for routine health maintenance examination Things have been going well No recent changes in his medical history or medications BP remains well controlled Tolerating statin therapy well with no side effects  Discussed colonoscopy screening--he declines Discussed further vascular studies--he declines  Remains active, plays indoor and outdoor tennis daily Very aware of healthy food options Sleeping well, no changes in his appetite  Current Medication: Outpatient Encounter Medications as of 03/01/2020  Medication Sig  . [DISCONTINUED] amLODipine (NORVASC) 5 MG tablet Take 1 tablet (5 mg total) by mouth daily.  . [DISCONTINUED] simvastatin (ZOCOR) 20 MG tablet Take 1 tablet (20 mg total) by mouth daily at 6 PM.  . amLODipine (NORVASC) 5 MG tablet Take 1 tablet (5 mg total) by mouth daily.  . simvastatin (ZOCOR) 20 MG tablet Take 1 tablet (20 mg total) by mouth daily at 6 PM.   No facility-administered encounter medications on file as of 03/01/2020.    Surgical History: Past Surgical History:  Procedure Laterality Date  . BACK SURGERY  2004    Medical History: Past Medical History:  Diagnosis Date  . Hyperlipidemia   . Hypertension     Family History: Family History  Problem Relation Age of Onset  . Diabetes Mother       Review of Systems  Constitutional: Negative for chills, fatigue and unexpected weight change.  HENT: Negative for congestion, postnasal drip, rhinorrhea, sneezing and sore throat.   Eyes: Negative for redness.  Respiratory: Negative for cough, chest tightness and  shortness of breath.   Cardiovascular: Negative for chest pain and palpitations.  Gastrointestinal: Negative for abdominal pain, constipation, diarrhea, nausea and vomiting.  Genitourinary: Negative for dysuria and frequency.  Musculoskeletal: Negative for arthralgias, back pain, joint swelling and neck pain.  Skin: Negative for rash.  Neurological: Negative for tremors and numbness.  Hematological: Negative for adenopathy. Does not bruise/bleed easily.  Psychiatric/Behavioral: Negative for behavioral problems (Depression), sleep disturbance and suicidal ideas. The patient is not nervous/anxious.      Vital Signs: BP 140/80   Pulse 94   Temp 97.8 F (36.6 C)   Resp 16   Ht 5\' 9"  (1.753 m)   Wt 145 lb 6.4 oz (66 kg)   SpO2 97%   BMI 21.47 kg/m    Physical Exam Vitals reviewed.  Constitutional:      Appearance: Normal appearance. He is normal weight.  Cardiovascular:     Rate and Rhythm: Normal rate and regular rhythm.     Pulses: Normal pulses.     Heart sounds: Normal heart sounds.  Pulmonary:     Effort: Pulmonary effort is normal.     Breath sounds: Normal breath sounds.  Abdominal:     General: Abdomen is flat.     Palpations: Abdomen is soft.  Musculoskeletal:        General: Normal range of motion.     Cervical back: Normal range of motion.  Skin:    General: Skin is warm.  Neurological:     General: No focal deficit present.  Mental Status: He is alert and oriented to person, place, and time. Mental status is at baseline.  Psychiatric:        Mood and Affect: Mood normal.        Behavior: Behavior normal.        Thought Content: Thought content normal.        Judgment: Judgment normal.    LABS: No results found for this or any previous visit (from the past 2160 hour(s)).  Assessment/Plan: 1. Encounter for routine adult health examination with abnormal findings Well appearing 65 year old male Declines PHM screenings Will review labs and adjust  therapy as indicated - CBC w/Diff/Platelet - Comprehensive Metabolic Panel (CMET) - Lipid Panel With LDL/HDL Ratio - TSH + free T4 - PSA  2. Screening for prostate cancer - PSA  3. Essential hypertension BP and HR well controlled - amLODipine (NORVASC) 5 MG tablet; Take 1 tablet (5 mg total) by mouth daily.  Dispense: 90 tablet; Refill: 1  4. Mixed hyperlipidemia Continue with statin, declines further vascular testing - simvastatin (ZOCOR) 20 MG tablet; Take 1 tablet (20 mg total) by mouth daily at 6 PM.  Dispense: 90 tablet; Refill: 1  5. Other fatigue - CBC w/Diff/Platelet - Comprehensive Metabolic Panel (CMET) - Lipid Panel With LDL/HDL Ratio - TSH + free T4 - PSA  General Counseling: Raymond Petersen verbalizes understanding of the findings of todays visit and agrees with plan of treatment. I have discussed any further diagnostic evaluation that may be needed or ordered today. We also reviewed his medications today. he has been encouraged to call the office with any questions or concerns that should arise related to todays visit.    Counseling: Hypertension Counseling:   The following hypertensive lifestyle modification were recommended and discussed:  1. Limiting alcohol intake to less than 1 oz/day of ethanol:(24 oz of beer or 8 oz of wine or 2 oz of 100-proof whiskey). 2. Take baby ASA 81 mg daily. 3. Importance of regular aerobic exercise and losing weight. 4. Reduce dietary saturated fat and cholesterol intake for overall cardiovascular health. 5. Maintaining adequate dietary potassium, calcium, and magnesium intake. 6. Regular monitoring of the blood pressure. 7. Reduce sodium intake to less than 100 mmol/day (less than 2.3 gm of sodium or less than 6 gm of sodium choride)    Orders Placed This Encounter  Procedures  . CBC w/Diff/Platelet  . Comprehensive Metabolic Panel (CMET)  . Lipid Panel With LDL/HDL Ratio  . TSH + free T4  . PSA    Meds ordered this encounter   Medications  . amLODipine (NORVASC) 5 MG tablet    Sig: Take 1 tablet (5 mg total) by mouth daily.    Dispense:  90 tablet    Refill:  1  . simvastatin (ZOCOR) 20 MG tablet    Sig: Take 1 tablet (20 mg total) by mouth daily at 6 PM.    Dispense:  90 tablet    Refill:  1    Total time spent: 30 Minutes  Time spent includes review of chart, medications, test results, and follow up plan with the patient.   This patient was seen by Leeanne Deed AGNP-C Collaboration with Dr Lyndon Code as a part of collaborative care agreement   Lubertha Basque. [facility] Internal Medicine

## 2020-03-02 ENCOUNTER — Encounter: Payer: Self-pay | Admitting: Hospice and Palliative Medicine

## 2020-03-03 LAB — COMPREHENSIVE METABOLIC PANEL
ALT: 25 IU/L (ref 0–44)
AST: 32 IU/L (ref 0–40)
Albumin/Globulin Ratio: 1.9 (ref 1.2–2.2)
Albumin: 4.6 g/dL (ref 3.8–4.8)
Alkaline Phosphatase: 58 IU/L (ref 44–121)
BUN/Creatinine Ratio: 13 (ref 10–24)
BUN: 13 mg/dL (ref 8–27)
Bilirubin Total: 0.5 mg/dL (ref 0.0–1.2)
CO2: 23 mmol/L (ref 20–29)
Calcium: 9.6 mg/dL (ref 8.6–10.2)
Chloride: 101 mmol/L (ref 96–106)
Creatinine, Ser: 1.04 mg/dL (ref 0.76–1.27)
Globulin, Total: 2.4 g/dL (ref 1.5–4.5)
Glucose: 99 mg/dL (ref 65–99)
Potassium: 4.7 mmol/L (ref 3.5–5.2)
Sodium: 139 mmol/L (ref 134–144)
Total Protein: 7 g/dL (ref 6.0–8.5)
eGFR: 81 mL/min/{1.73_m2} (ref >59)

## 2020-03-03 LAB — CBC WITH DIFFERENTIAL/PLATELET
Basophils Absolute: 0.1 10*3/uL (ref 0.0–0.2)
Basos: 1 %
EOS (ABSOLUTE): 0.2 10*3/uL (ref 0.0–0.4)
Eos: 2 %
Hematocrit: 45.8 % (ref 37.5–51.0)
Hemoglobin: 15.1 g/dL (ref 13.0–17.7)
Immature Grans (Abs): 0 10*3/uL (ref 0.0–0.1)
Immature Granulocytes: 0 %
Lymphocytes Absolute: 2.9 10*3/uL (ref 0.7–3.1)
Lymphs: 42 %
MCH: 28.8 pg (ref 26.6–33.0)
MCHC: 33 g/dL (ref 31.5–35.7)
MCV: 87 fL (ref 79–97)
Monocytes Absolute: 0.8 10*3/uL (ref 0.1–0.9)
Monocytes: 11 %
Neutrophils Absolute: 3 10*3/uL (ref 1.4–7.0)
Neutrophils: 44 %
Platelets: 252 10*3/uL (ref 150–450)
RBC: 5.24 x10E6/uL (ref 4.14–5.80)
RDW: 13.1 % (ref 11.6–15.4)
WBC: 6.9 10*3/uL (ref 3.4–10.8)

## 2020-03-03 LAB — LIPID PANEL WITH LDL/HDL RATIO
Cholesterol, Total: 143 mg/dL (ref 100–199)
HDL: 42 mg/dL (ref >39)
LDL Chol Calc (NIH): 81 mg/dL (ref 0–99)
LDL/HDL Ratio: 1.9 ratio (ref 0.0–3.6)
Triglycerides: 111 mg/dL (ref 0–149)
VLDL Cholesterol Cal: 20 mg/dL (ref 5–40)

## 2020-03-03 LAB — PSA: Prostate Specific Ag, Serum: 1.1 ng/mL (ref 0.0–4.0)

## 2020-03-03 LAB — TSH+FREE T4
Free T4: 1.16 ng/dL (ref 0.82–1.77)
TSH: 3.12 u[IU]/mL (ref 0.450–4.500)

## 2020-09-01 ENCOUNTER — Other Ambulatory Visit: Payer: Self-pay

## 2020-09-01 ENCOUNTER — Other Ambulatory Visit: Payer: Self-pay | Admitting: Nurse Practitioner

## 2020-09-01 DIAGNOSIS — E782 Mixed hyperlipidemia: Secondary | ICD-10-CM

## 2020-09-01 MED ORDER — SIMVASTATIN 20 MG PO TABS: 20.0000 mg | ORAL_TABLET | Freq: Every day | ORAL | 0 refills | Status: DC

## 2020-09-19 ENCOUNTER — Other Ambulatory Visit: Payer: Self-pay | Admitting: Physician Assistant

## 2020-09-19 ENCOUNTER — Telehealth: Payer: Self-pay

## 2020-09-19 ENCOUNTER — Other Ambulatory Visit: Payer: Self-pay

## 2020-09-19 ENCOUNTER — Encounter: Payer: Self-pay | Admitting: Physician Assistant

## 2020-09-19 ENCOUNTER — Ambulatory Visit: Payer: BLUE CROSS/BLUE SHIELD | Admitting: Physician Assistant

## 2020-09-19 DIAGNOSIS — M25511 Pain in right shoulder: Secondary | ICD-10-CM

## 2020-09-19 DIAGNOSIS — I10 Essential (primary) hypertension: Secondary | ICD-10-CM | POA: Diagnosis not present

## 2020-09-19 MED ORDER — MELOXICAM 15 MG PO TABS: 15.0000 mg | ORAL_TABLET | Freq: Every day | ORAL | 0 refills | Status: DC

## 2020-09-19 NOTE — Progress Notes (Signed)
[facility] [address] [place], [place] [address]  Internal MEDICINE  Office Visit Note  Patient Name: Raymond Petersen  [id number]  [id number]  Date of Service: 09/20/2020  Chief Complaint  Patient presents with   Acute Visit   Shoulder Pain    Right side      HPI Pt is here for a sick visit. -Right shoulder pain started at the end of July. Took a month off from tennis. 2 weeks ago did try to play again with some ibuprofen, but it still hurt so stopped. Not taking any medications for it regularly. Pain started slowly, no acute injury that he is aware of. -Pain with movement and difficulty getting to full ROM. Denies any swelling, but does have tenderness on palpation of the front/top of shoulder. Icy hot on it does not help much. No heating pads or ice packs. Does not hurt much to sleep on that shoulder. -Limited Rom and pinpoint tenderness leads to concern of impingement or possible rotator cuff tear. No evidence of dislocation. May try to walk in at [facility]  Current Medication:  Outpatient Encounter Medications as of 09/19/2020  Medication Sig   amLODipine (NORVASC) 5 MG tablet Take 1 tablet (5 mg total) by mouth daily.   meloxicam (MOBIC) 15 MG tablet Take 1 tablet (15 mg total) by mouth daily.   simvastatin (ZOCOR) 20 MG tablet Take 1 tablet (20 mg total) by mouth daily at 6 PM.   No facility-administered encounter medications on file as of 09/19/2020.      Medical History: Past Medical History:  Diagnosis Date   Hyperlipidemia    Hypertension      Vital Signs: BP 130/90   Pulse 100   Temp 97.8 F (36.6 C)   Resp 16   Ht 5\' 9"  (1.753 m)   Wt 140 lb (63.5 kg)   SpO2 98%   BMI 20.67 kg/m    Review of Systems  Constitutional:  Negative for fatigue and fever.  HENT:  Negative for congestion, mouth sores and postnasal drip.   Respiratory:  Negative for cough.   Cardiovascular:  Negative for chest pain.  Genitourinary:  Negative for  flank pain.  Musculoskeletal:  Positive for arthralgias.       Right shoulder pain  Psychiatric/Behavioral: Negative.     Physical Exam Vitals and nursing note reviewed.  Constitutional:      General: He is not in acute distress.    Appearance: He is well-developed and normal weight. He is not diaphoretic.  HENT:     Head: Normocephalic and atraumatic.     Mouth/Throat:     Pharynx: No oropharyngeal exudate.  Eyes:     Pupils: Pupils are equal, round, and reactive to light.  Neck:     Thyroid: No thyromegaly.     Vascular: No JVD.     Trachea: No tracheal deviation.  Cardiovascular:     Rate and Rhythm: Normal rate and regular rhythm.     Heart sounds: Normal heart sounds. No murmur heard.   No friction rub. No gallop.  Pulmonary:     Effort: Pulmonary effort is normal. No respiratory distress.     Breath sounds: No wheezing or rales.  Chest:     Chest wall: No tenderness.  Abdominal:     General: Bowel sounds are normal.     Palpations: Abdomen is soft.  Musculoskeletal:        General: Tenderness present. Normal range of motion.  Cervical back: Normal range of motion and neck supple.     Comments: Right shoulder pain and limited ROM, +Hawkins and +empty can. Tenderness along top/front of shoulder  Lymphadenopathy:     Cervical: No cervical adenopathy.  Skin:    General: Skin is warm and dry.  Neurological:     Mental Status: He is alert and oriented to person, place, and time.     Cranial Nerves: No cranial nerve deficit.  Psychiatric:        Behavior: Behavior normal.        Thought Content: Thought content normal.        Judgment: Judgment normal.      Assessment/Plan: 1. Acute pain of right shoulder Will start on mobic daily for 4 weeks to help with reducing inflammation. Patient will rest and complete gentle ROM exercises and utilize ice/heat. May require MRI for further evaluation, Ortho referral placed. Patient may also try to walk in to [facility] -  meloxicam (MOBIC) 15 MG tablet; Take 1 tablet (15 mg total) by mouth daily.  Dispense: 30 tablet; Refill: 0 - AMB referral to orthopedics  2. Essential hypertension Stable, continue current medications.   General Counseling: parnell spieler understanding of the findings of todays visit and agrees with plan of treatment. I have discussed any further diagnostic evaluation that may be needed or ordered today. We also reviewed his medications today. he has been encouraged to call the office with any questions or concerns that should arise related to todays visit.    Counseling:    Orders Placed This Encounter  Procedures   AMB referral to orthopedics    Meds ordered this encounter  Medications   meloxicam (MOBIC) 15 MG tablet    Sig: Take 1 tablet (15 mg total) by mouth daily.    Dispense:  30 tablet    Refill:  0    Time spent:30 Minutes

## 2020-09-19 NOTE — Telephone Encounter (Signed)
Orthopedic referral sent via [facility] to [facility]

## 2020-09-19 NOTE — Telephone Encounter (Signed)
Patient scheduled 10/19/20 @ 10:40-Toni

## 2020-10-04 ENCOUNTER — Other Ambulatory Visit: Payer: Self-pay | Admitting: Nurse Practitioner

## 2020-10-04 DIAGNOSIS — E782 Mixed hyperlipidemia: Secondary | ICD-10-CM

## 2020-10-31 ENCOUNTER — Other Ambulatory Visit: Payer: Self-pay

## 2020-10-31 DIAGNOSIS — I10 Essential (primary) hypertension: Secondary | ICD-10-CM

## 2020-10-31 MED ORDER — AMLODIPINE BESYLATE 5 MG PO TABS: 5.0000 mg | ORAL_TABLET | Freq: Every day | ORAL | 1 refills | Status: DC

## 2020-11-01 ENCOUNTER — Other Ambulatory Visit: Payer: Self-pay | Admitting: Nurse Practitioner

## 2020-11-01 ENCOUNTER — Other Ambulatory Visit: Payer: Self-pay

## 2020-11-01 DIAGNOSIS — E782 Mixed hyperlipidemia: Secondary | ICD-10-CM

## 2020-11-01 DIAGNOSIS — I10 Essential (primary) hypertension: Secondary | ICD-10-CM

## 2020-11-01 MED ORDER — SIMVASTATIN 20 MG PO TABS: 20.0000 mg | ORAL_TABLET | Freq: Every day | ORAL | 1 refills | Status: DC

## 2020-11-01 MED ORDER — AMLODIPINE BESYLATE 5 MG PO TABS: 5.0000 mg | ORAL_TABLET | Freq: Every day | ORAL | 1 refills | Status: DC

## 2021-02-28 ENCOUNTER — Telehealth: Payer: Self-pay

## 2021-02-28 NOTE — Telephone Encounter (Signed)
Left vm and sent mychart message to confirm 03/02/21 appointment-Toni °

## 2021-03-02 ENCOUNTER — Encounter: Payer: BLUE CROSS/BLUE SHIELD | Admitting: Nurse Practitioner

## 2021-04-12 ENCOUNTER — Ambulatory Visit (INDEPENDENT_AMBULATORY_CARE_PROVIDER_SITE_OTHER): Payer: BLUE CROSS/BLUE SHIELD | Admitting: Nurse Practitioner

## 2021-04-12 ENCOUNTER — Encounter: Payer: Self-pay | Admitting: Nurse Practitioner

## 2021-04-12 VITALS — BP 140/86 | HR 90 | Temp 98.0°F | Resp 16 | Ht 69.0 in | Wt 147.0 lb

## 2021-04-12 DIAGNOSIS — E782 Mixed hyperlipidemia: Secondary | ICD-10-CM

## 2021-04-12 DIAGNOSIS — Z125 Encounter for screening for malignant neoplasm of prostate: Secondary | ICD-10-CM

## 2021-04-12 DIAGNOSIS — R300 Dysuria: Secondary | ICD-10-CM

## 2021-04-12 DIAGNOSIS — I10 Essential (primary) hypertension: Secondary | ICD-10-CM

## 2021-04-12 DIAGNOSIS — Z0001 Encounter for general adult medical examination with abnormal findings: Secondary | ICD-10-CM | POA: Diagnosis not present

## 2021-04-12 MED ORDER — AMLODIPINE BESYLATE 5 MG PO TABS: 5.0000 mg | ORAL_TABLET | Freq: Every day | ORAL | 3 refills | Status: DC

## 2021-04-12 MED ORDER — SIMVASTATIN 20 MG PO TABS: 20.0000 mg | ORAL_TABLET | Freq: Every day | ORAL | 3 refills | Status: DC

## 2021-04-12 NOTE — Progress Notes (Signed)
[facility] ?[address] ?[place], [place] [address] ? ?Internal MEDICINE  ?Office Visit Note ? ?Patient Name: Raymond Petersen ? [id number]  ?[id number] ? ?Date of Service: 04/12/2021 ? ?Chief Complaint  ?Patient presents with  ? Annual Exam  ? Hypertension  ? Hyperlipidemia  ? ? ?HPI ?Raymond Petersen presents for an annual well visit and physical exam.  He is a well-appearing 66 year old male with hypertension and hyperlipidemia that is well controlled with minimal medication.  He is due for routine labs.  He had rotator cuff surgery in December last year and has been recovering very well.  He does have a few more physical therapy appointments left this month.  He is due for colorectal cancer screening but declined GI referral for routine colonoscopy.  Discussed Cologuard stool test with patient and he said he will think about the Cologuard test and will let the clinic know if he decides to do it.  ?He denies any new or worsening pains.  He has no other concerns or questions today. ? ? ? ?Current Medication: ?Outpatient Encounter Medications as of 04/12/2021  ?Medication Sig  ? meloxicam (MOBIC) 15 MG tablet Take 1 tablet (15 mg total) by mouth daily.  ? [DISCONTINUED] amLODipine (NORVASC) 5 MG tablet Take 1 tablet (5 mg total) by mouth daily.  ? [DISCONTINUED] simvastatin (ZOCOR) 20 MG tablet Take 1 tablet (20 mg total) by mouth daily at 6 PM.  ? amLODipine (NORVASC) 5 MG tablet Take 1 tablet (5 mg total) by mouth daily.  ? simvastatin (ZOCOR) 20 MG tablet Take 1 tablet (20 mg total) by mouth daily at 6 PM.  ? ?No facility-administered encounter medications on file as of 04/12/2021.  ? ? ?Surgical History: ?Past Surgical History:  ?Procedure Laterality Date  ? BACK SURGERY  2004  ? ? ?Medical History: ?Past Medical History:  ?Diagnosis Date  ? Hyperlipidemia   ? Hypertension   ? ? ?Family History: ?Family History  ?Problem Relation Age of Onset  ? Diabetes Mother   ? ? ?Social History  ? ?Socioeconomic History   ? Marital status: Married  ?  Spouse name: Not on file  ? Number of children: Not on file  ? Years of education: Not on file  ? Highest education level: Not on file  ?Occupational History  ? Not on file  ?Tobacco Use  ? Smoking status: Never  ? Smokeless tobacco: Never  ?Substance and Sexual Activity  ? Alcohol use: No  ? Drug use: No  ? Sexual activity: Not on file  ?Other Topics Concern  ? Not on file  ?Social History Narrative  ? Not on file  ? ?Social Determinants of Health  ? ?Financial Resource Strain: Not on file  ?Food Insecurity: Not on file  ?Transportation Needs: Not on file  ?Physical Activity: Not on file  ?Stress: Not on file  ?Social Connections: Not on file  ?Intimate Partner Violence: Not on file  ? ? ? ? ?Review of Systems  ?Constitutional:  Negative for activity change, appetite change, chills, fatigue, fever and unexpected weight change.  ?HENT: Negative.  Negative for congestion, ear pain, rhinorrhea, sore throat and trouble swallowing.   ?Eyes: Negative.   ?Respiratory: Negative.  Negative for cough, chest tightness, shortness of breath and wheezing.   ?Cardiovascular: Negative.  Negative for chest pain.  ?Gastrointestinal: Negative.  Negative for abdominal pain, blood in stool, constipation, diarrhea, nausea and vomiting.  ?Endocrine: Negative.   ?Genitourinary: Negative.  Negative for difficulty urinating, dysuria,  frequency, hematuria and urgency.  ?Musculoskeletal: Negative.  Negative for arthralgias, back pain, joint swelling, myalgias and neck pain.  ?Skin: Negative.  Negative for rash and wound.  ?Allergic/Immunologic: Negative.  Negative for immunocompromised state.  ?Neurological: Negative.  Negative for dizziness, seizures, numbness and headaches.  ?Hematological: Negative.   ?Psychiatric/Behavioral: Negative.  Negative for behavioral problems, self-injury and suicidal ideas. The patient is not nervous/anxious.   ? ?Vital Signs: ?BP 140/86   Pulse 90   Temp 98 ?F (36.7 ?C)   Resp  16   Ht 5' 9"  (1.753 m)   Wt 147 lb (66.7 kg)   SpO2 97%   BMI 21.71 kg/m?  ? ? ?Physical Exam ?Vitals reviewed.  ?Constitutional:   ?   General: He is awake. He is not in acute distress. ?   Appearance: Normal appearance. He is well-developed, well-groomed and normal weight. He is not ill-appearing or diaphoretic.  ?HENT:  ?   Head: Normocephalic and atraumatic.  ?   Right Ear: Tympanic membrane, ear canal and external ear normal.  ?   Left Ear: Tympanic membrane, ear canal and external ear normal.  ?   Nose: Nose normal.  ?   Mouth/Throat:  ?   Mouth: Mucous membranes are moist.  ?   Pharynx: Oropharynx is clear. No oropharyngeal exudate or posterior oropharyngeal erythema.  ?Eyes:  ?   General: No scleral icterus.    ?   Right eye: No discharge.     ?   Left eye: No discharge.  ?   Extraocular Movements: Extraocular movements intact.  ?   Conjunctiva/sclera: Conjunctivae normal.  ?   Pupils: Pupils are equal, round, and reactive to light.  ?Neck:  ?   Thyroid: No thyromegaly.  ?   Vascular: No carotid bruit or JVD.  ?   Trachea: Trachea and phonation normal. No tracheal deviation.  ?Cardiovascular:  ?   Rate and Rhythm: Normal rate and regular rhythm.  ?   Pulses: Normal pulses.  ?   Heart sounds: Normal heart sounds, S1 normal and S2 normal. No murmur heard. ?  No friction rub. No gallop.  ?Pulmonary:  ?   Effort: Pulmonary effort is normal. No accessory muscle usage or respiratory distress.  ?   Breath sounds: Normal breath sounds and air entry. No stridor. No wheezing or rales.  ?Chest:  ?   Chest wall: No tenderness.  ?Abdominal:  ?   General: Bowel sounds are normal. There is no distension.  ?   Palpations: Abdomen is soft. There is no shifting dullness, fluid wave, mass or pulsatile mass.  ?   Tenderness: There is no abdominal tenderness. There is no guarding or rebound.  ?Musculoskeletal:     ?   General: No tenderness or deformity. Normal range of motion.  ?   Cervical back: Normal range of motion and  neck supple.  ?   Right lower leg: No edema.  ?   Left lower leg: No edema.  ?Lymphadenopathy:  ?   Cervical: No cervical adenopathy.  ?Skin: ?   General: Skin is warm and dry.  ?   Capillary Refill: Capillary refill takes less than 2 seconds.  ?   Coloration: Skin is not pale.  ?   Findings: No erythema or rash.  ?Neurological:  ?   Mental Status: He is alert and oriented to person, place, and time.  ?   Cranial Nerves: No cranial nerve deficit.  ?   Motor: No abnormal  muscle tone.  ?   Coordination: Coordination normal.  ?   Gait: Gait normal.  ?   Deep Tendon Reflexes: Reflexes are normal and symmetric.  ?Psychiatric:     ?   Mood and Affect: Mood normal.     ?   Behavior: Behavior normal. Behavior is cooperative.     ?   Thought Content: Thought content normal.     ?   Judgment: Judgment normal.  ? ? ? ? ? ?Assessment/Plan: ?1. Encounter for routine adult health examination with abnormal findings ?Age-appropriate preventive screenings and vaccinations discussed, annual physical exam completed. Routine labs for health maintenance ordered, see below. PHM updated.  ?- TSH + free T4 ? ?2. Essential hypertension ?Stable on amlodipine 5 mg daily. Routine labs ordered and medication refills ordered ?- amLODipine (NORVASC) 5 MG tablet; Take 1 tablet (5 mg total) by mouth daily.  Dispense: 90 tablet; Refill: 3 ?- CBC with Differential/Platelet ?- CMP14+EGFR ?- Lipid Profile ?- TSH + free T4 ? ?3. Mixed hyperlipidemia ?Stable, routine labs ordered, simvastatin refills ordered ?- simvastatin (ZOCOR) 20 MG tablet; Take 1 tablet (20 mg total) by mouth daily at 6 PM.  Dispense: 90 tablet; Refill: 3 ?- Lipid Profile ?- TSH + free T4 ? ?4. Dysuria ?Routine urinalysis done ?- UA/M w/rflx Culture, Routine ? ?5. Screening for prostate cancer ?Routine lab ordered ?- PSA Total (Reflex To Free) ? ? ? ? ? ?General Counseling: konner saiz understanding of the findings of todays visit and agrees with plan of treatment. I have  discussed any further diagnostic evaluation that may be needed or ordered today. We also reviewed his medications today. he has been encouraged to call the office with any questions or concerns that should ari

## 2021-04-13 LAB — UA/M W/RFLX CULTURE, ROUTINE
Bilirubin, UA: NEGATIVE
Glucose, UA: NEGATIVE
Ketones, UA: NEGATIVE
Leukocytes,UA: NEGATIVE
Nitrite, UA: NEGATIVE
Protein,UA: NEGATIVE
RBC, UA: NEGATIVE
Specific Gravity, UA: 1.02 (ref 1.005–1.030)
Urobilinogen, Ur: 0.2 mg/dL (ref 0.2–1.0)
pH, UA: 6.5 (ref 5.0–7.5)

## 2021-04-13 LAB — MICROSCOPIC EXAMINATION
Bacteria, UA: NONE SEEN
Casts: NONE SEEN /lpf
Epithelial Cells (non renal): NONE SEEN /hpf (ref 0–10)
WBC, UA: NONE SEEN /hpf (ref 0–5)

## 2021-04-14 LAB — CBC WITH DIFFERENTIAL/PLATELET
Basophils Absolute: 0 10*3/uL (ref 0.0–0.2)
Basos: 0 %
EOS (ABSOLUTE): 0.1 10*3/uL (ref 0.0–0.4)
Eos: 2 %
Hematocrit: 45.4 % (ref 37.5–51.0)
Hemoglobin: 15.5 g/dL (ref 13.0–17.7)
Immature Grans (Abs): 0 10*3/uL (ref 0.0–0.1)
Immature Granulocytes: 0 %
Lymphocytes Absolute: 3.4 10*3/uL — ABNORMAL HIGH (ref 0.7–3.1)
Lymphs: 49 %
MCH: 29 pg (ref 26.6–33.0)
MCHC: 34.1 g/dL (ref 31.5–35.7)
MCV: 85 fL (ref 79–97)
Monocytes Absolute: 0.8 10*3/uL (ref 0.1–0.9)
Monocytes: 12 %
Neutrophils Absolute: 2.6 10*3/uL (ref 1.4–7.0)
Neutrophils: 37 %
Platelets: 241 10*3/uL (ref 150–450)
RBC: 5.34 x10E6/uL (ref 4.14–5.80)
RDW: 13.2 % (ref 11.6–15.4)
WBC: 6.9 10*3/uL (ref 3.4–10.8)

## 2021-04-14 LAB — PSA TOTAL (REFLEX TO FREE): Prostate Specific Ag, Serum: 1.2 ng/mL (ref 0.0–4.0)

## 2021-04-14 LAB — TSH+FREE T4
Free T4: 1.11 ng/dL (ref 0.82–1.77)
TSH: 4.51 u[IU]/mL — ABNORMAL HIGH (ref 0.450–4.500)

## 2021-04-14 LAB — CMP14+EGFR
ALT: 26 IU/L (ref 0–44)
AST: 44 IU/L — ABNORMAL HIGH (ref 0–40)
Albumin/Globulin Ratio: 1.6 (ref 1.2–2.2)
Albumin: 4.5 g/dL (ref 3.8–4.8)
Alkaline Phosphatase: 63 IU/L (ref 44–121)
BUN/Creatinine Ratio: 17 (ref 10–24)
BUN: 17 mg/dL (ref 8–27)
Bilirubin Total: 0.5 mg/dL (ref 0.0–1.2)
CO2: 22 mmol/L (ref 20–29)
Calcium: 9.2 mg/dL (ref 8.6–10.2)
Chloride: 102 mmol/L (ref 96–106)
Creatinine, Ser: 1 mg/dL (ref 0.76–1.27)
Globulin, Total: 2.9 g/dL (ref 1.5–4.5)
Glucose: 105 mg/dL — ABNORMAL HIGH (ref 70–99)
Potassium: 4.2 mmol/L (ref 3.5–5.2)
Sodium: 140 mmol/L (ref 134–144)
Total Protein: 7.4 g/dL (ref 6.0–8.5)
eGFR: 85 mL/min/{1.73_m2} (ref >59)

## 2021-04-14 LAB — LIPID PANEL
Chol/HDL Ratio: 3.5 ratio (ref 0.0–5.0)
Cholesterol, Total: 152 mg/dL (ref 100–199)
HDL: 44 mg/dL (ref >39)
LDL Chol Calc (NIH): 89 mg/dL (ref 0–99)
Triglycerides: 106 mg/dL (ref 0–149)
VLDL Cholesterol Cal: 19 mg/dL (ref 5–40)

## 2021-04-17 NOTE — Progress Notes (Signed)
Please call patient and let him know his results: ?-- PSA level is normal ?--Cholesterol levels are normal ?--CBC is grossly normal, white blood cell count is normal but there is a slightly elevated absolute lymphocyte count which can be seen in an acute infection including a viral cold or upper respiratory infection. ?--Metabolic panel is grossly normal, there is a slight elevation in 1 liver enzyme, the AST which has been slightly elevated in the past as well.  I suggest doing a liver ultrasound if the patient is agreeable to evaluate for structural changes.  If he does not want to do a liver ultrasound, I recommend repeating the liver function panel and 3 months. ?--His TSH level is slightly elevated, there is nothing to do if he is not having any symptoms.  Symptoms of hypothyroidism include weight gain, low energy, fatigue, hair loss.  These are some of the most common signs and symptoms seen with hypothyroidism.  As long as he does not have any signs or symptoms, we can wait and repeat the thyroid lab in 3 months.

## 2021-04-19 ENCOUNTER — Telehealth: Payer: Self-pay

## 2021-04-19 NOTE — Telephone Encounter (Signed)
-----   Message from Sallyanne Kuster, NP sent at 04/17/2021  9:56 PM EDT ----- ?Please call patient and let him know his results: ?-- PSA level is normal ?--Cholesterol levels are normal ?--CBC is grossly normal, white blood cell count is normal but there is a slightly elevated absolute lymphocyte count which can be seen in an acute infection including a viral cold or upper respiratory infection. ?--Metabolic panel is grossly normal, there is a slight elevation in 1 liver enzyme, the AST which has been slightly elevated in the past as well.  I suggest doing a liver ultrasound if the patient is agreeable to evaluate for structural changes.  If he does not want to do a liver ultrasound, I recommend repeating the liver function panel and 3 months. ?--His TSH level is slightly elevated, there is nothing to do if he is not having any symptoms.  Symptoms of hypothyroidism include weight gain, low energy, fatigue, hair loss.  These are some of the most common signs and symptoms seen with hypothyroidism.  As long as he does not have any signs or symptoms, we can wait and repeat the thyroid lab in 3 months. ?

## 2021-04-26 ENCOUNTER — Ambulatory Visit (INDEPENDENT_AMBULATORY_CARE_PROVIDER_SITE_OTHER): Payer: BLUE CROSS/BLUE SHIELD | Admitting: Nurse Practitioner

## 2021-04-26 ENCOUNTER — Encounter: Payer: Self-pay | Admitting: Nurse Practitioner

## 2021-04-26 VITALS — BP 140/87 | HR 83 | Temp 98.6°F | Resp 16 | Ht 69.0 in | Wt 147.0 lb

## 2021-04-26 DIAGNOSIS — R7989 Other specified abnormal findings of blood chemistry: Secondary | ICD-10-CM | POA: Diagnosis not present

## 2021-04-26 DIAGNOSIS — B9689 Other specified bacterial agents as the cause of diseases classified elsewhere: Secondary | ICD-10-CM | POA: Diagnosis not present

## 2021-04-26 DIAGNOSIS — J028 Acute pharyngitis due to other specified organisms: Secondary | ICD-10-CM

## 2021-04-26 DIAGNOSIS — R7401 Elevation of levels of liver transaminase levels: Secondary | ICD-10-CM

## 2021-04-26 MED ORDER — AZITHROMYCIN 250 MG PO TABS: ORAL_TABLET | ORAL | 0 refills | Status: AC

## 2021-04-26 NOTE — Progress Notes (Signed)
[facility] [address] [place], [place] [address]  Internal MEDICINE  Office Visit Note  Patient Name: Raymond Petersen  [id number]  [id number]  Date of Service: 04/26/2021  Chief Complaint  Patient presents with   Acute Visit   Sore Throat    For a few months, back of throat, pt thinks maybe he has URI and wants it looked at based on blood work, clearing throat frequently, sore throat worse at night    URI     HPI Elye presents for an acute sick visit for having a sore throat for a few months, thinks that it could be an upper respiratory infection and wants to have this looked at, reports clearing his throat frequently and the sore throat is worse at night and very dry. Routine labs from his annual physical exam showed elevated AST on his metabolic panel and elevated TSH on his thyroid panel.   Current Medication:  Outpatient Encounter Medications as of 04/26/2021  Medication Sig   amLODipine (NORVASC) 5 MG tablet Take 1 tablet (5 mg total) by mouth daily.   [EXPIRED] azithromycin (ZITHROMAX) 250 MG tablet Take 2 tablets on day 1, then 1 tablet daily on days 2 through 5   meloxicam (MOBIC) 15 MG tablet Take 1 tablet (15 mg total) by mouth daily.   simvastatin (ZOCOR) 20 MG tablet Take 1 tablet (20 mg total) by mouth daily at 6 PM.   No facility-administered encounter medications on file as of 04/26/2021.      Medical History: Past Medical History:  Diagnosis Date   Hyperlipidemia    Hypertension      Vital Signs: BP 140/87   Pulse 83   Temp 98.6 F (37 C)   Resp 16   Ht 5\' 9"  (1.753 m)   Wt 147 lb (66.7 kg)   SpO2 98%   BMI 21.71 kg/m    Review of Systems  Constitutional:  Negative for chills, fatigue and unexpected weight change.  HENT:  Positive for congestion, postnasal drip, rhinorrhea, sore throat and trouble swallowing. Negative for sneezing.   Eyes:  Negative for redness.  Respiratory:  Positive for cough. Negative for chest  tightness, shortness of breath and wheezing.   Cardiovascular: Negative.  Negative for chest pain and palpitations.  Gastrointestinal: Negative.  Negative for abdominal pain, constipation, diarrhea, nausea and vomiting.  Musculoskeletal:  Negative for arthralgias, back pain, joint swelling and neck pain.  Psychiatric/Behavioral:  Behavioral problem: Depression.    Physical Exam Vitals reviewed.  Constitutional:      General: He is not in acute distress.    Appearance: Normal appearance. He is normal weight. He is not ill-appearing.  HENT:     Head: Normocephalic and atraumatic.     Right Ear: Tympanic membrane, ear canal and external ear normal.     Left Ear: Tympanic membrane, ear canal and external ear normal.     Nose: Congestion and rhinorrhea present.     Mouth/Throat:     Mouth: Mucous membranes are moist.     Pharynx: Posterior oropharyngeal erythema present.  Eyes:     Extraocular Movements: Extraocular movements intact.     Pupils: Pupils are equal, round, and reactive to light.  Cardiovascular:     Rate and Rhythm: Normal rate and regular rhythm.     Heart sounds: Normal heart sounds. No murmur heard. Pulmonary:     Effort: Pulmonary effort is normal. No respiratory distress.     Breath sounds: Normal breath  sounds. No wheezing.  Neurological:     Mental Status: He is alert and oriented to person, place, and time.  Psychiatric:        Mood and Affect: Mood normal.        Behavior: Behavior normal.      Assessment/Plan: 1. Acute bacterial pharyngitis 5-day Z-Pak prescribed, call clinic if no improvement or worsening of symptoms after 5 days.   - azithromycin (ZITHROMAX) 250 MG tablet; Take 2 tablets on day 1, then 1 tablet daily on days 2 through 5  Dispense: 6 tablet; Refill: 0  2. Elevated TSH Repeat thyroid panel in 3 months - TSH + free T4  3. Elevated AST (SGOT) Repeat hepatic function panel in 3 months - Hepatic function panel   General Counseling:  Bess Harvest understanding of the findings of todays visit and agrees with plan of treatment. I have discussed any further diagnostic evaluation that may be needed or ordered today. We also reviewed his medications today. he has been encouraged to call the office with any questions or concerns that should arise related to todays visit.    Counseling:    Orders Placed This Encounter  Procedures   Hepatic function panel   TSH + free T4    Meds ordered this encounter  Medications   azithromycin (ZITHROMAX) 250 MG tablet    Sig: Take 2 tablets on day 1, then 1 tablet daily on days 2 through 5    Dispense:  6 tablet    Refill:  0    Return in about 3 months (around 07/26/2021) for F/U, Labs, Naithan Delage PCP.  [place] Controlled Substance Database was reviewed by me for overdose risk score (ORS)  Time spent:30 Minutes Time spent with patient included reviewing progress notes, labs, imaging studies, and discussing plan for follow up.   This patient was seen by Sallyanne Kuster, FNP-C in collaboration with Dr. Beverely Risen as a part of collaborative care agreement.  Shimon Trowbridge R. Tedd Sias, MSN, FNP-C Internal Medicine

## 2021-06-04 ENCOUNTER — Encounter: Payer: Self-pay | Admitting: Nurse Practitioner

## 2021-06-12 ENCOUNTER — Other Ambulatory Visit: Payer: Self-pay

## 2021-06-12 NOTE — Progress Notes (Signed)
Pt cleared pre-employment UDS, HR notified. 

## 2021-07-17 ENCOUNTER — Other Ambulatory Visit: Payer: Self-pay | Admitting: Nurse Practitioner

## 2021-07-18 LAB — HEPATIC FUNCTION PANEL
ALT: 35 IU/L (ref 0–44)
AST: 37 IU/L (ref 0–40)
Albumin: 4.6 g/dL (ref 3.9–4.9)
Alkaline Phosphatase: 54 IU/L (ref 44–121)
Bilirubin Total: 0.7 mg/dL (ref 0.0–1.2)
Bilirubin, Direct: 0.15 mg/dL (ref 0.00–0.40)
Total Protein: 7.1 g/dL (ref 6.0–8.5)

## 2021-07-18 LAB — TSH: TSH: 2.72 u[IU]/mL (ref 0.450–4.500)

## 2021-07-18 LAB — T4, FREE: Free T4: 1.15 ng/dL (ref 0.82–1.77)

## 2021-07-26 ENCOUNTER — Ambulatory Visit: Payer: Self-pay | Admitting: Nurse Practitioner

## 2021-07-26 ENCOUNTER — Telehealth: Payer: Self-pay

## 2021-07-26 NOTE — Telephone Encounter (Signed)
Spoke with patient regarding lab results. 

## 2021-07-26 NOTE — Telephone Encounter (Signed)
-----   Message from Loura Back, [place] sent at 07/26/2021 11:08 AM EDT -----  ----- Message ----- From: Lyndon Code, MD Sent: 07/24/2021  10:16 AM EDT To: Loura Back, CMA  Labs look within limits

## 2021-10-11 ENCOUNTER — Emergency Department: Payer: BLUE CROSS/BLUE SHIELD

## 2021-10-11 ENCOUNTER — Other Ambulatory Visit: Payer: Self-pay

## 2021-10-11 ENCOUNTER — Observation Stay
Admission: EM | Admit: 2021-10-11 | Discharge: 2021-10-12 | Disposition: A | Discharge status: Home / Self Care | Payer: BLUE CROSS/BLUE SHIELD | Attending: Hospitalist | Admitting: Hospitalist

## 2021-10-11 DIAGNOSIS — E782 Mixed hyperlipidemia: Secondary | ICD-10-CM | POA: Diagnosis present

## 2021-10-11 DIAGNOSIS — U071 COVID-19: Secondary | ICD-10-CM | POA: Diagnosis not present

## 2021-10-11 DIAGNOSIS — I10 Essential (primary) hypertension: Secondary | ICD-10-CM | POA: Insufficient documentation

## 2021-10-11 DIAGNOSIS — R55 Syncope and collapse: Secondary | ICD-10-CM | POA: Diagnosis present

## 2021-10-11 DIAGNOSIS — Z79899 Other long term (current) drug therapy: Secondary | ICD-10-CM | POA: Diagnosis not present

## 2021-10-11 LAB — CBC
HCT: 47 % (ref 39.0–52.0)
Hemoglobin: 15.6 g/dL (ref 13.0–17.0)
MCH: 28.7 pg (ref 26.0–34.0)
MCHC: 33.2 g/dL (ref 30.0–36.0)
MCV: 86.6 fL (ref 80.0–100.0)
Platelets: 207 10*3/uL (ref 150–400)
RBC: 5.43 MIL/uL (ref 4.22–5.81)
RDW: 12.4 % (ref 11.5–15.5)
WBC: 7.8 10*3/uL (ref 4.0–10.5)
nRBC: 0 % (ref 0.0–0.2)

## 2021-10-11 LAB — URINALYSIS, ROUTINE W REFLEX MICROSCOPIC
Bacteria, UA: NONE SEEN
Bilirubin Urine: NEGATIVE
Glucose, UA: NEGATIVE mg/dL
Ketones, ur: NEGATIVE mg/dL
Leukocytes,Ua: NEGATIVE
Nitrite: NEGATIVE
Protein, ur: NEGATIVE mg/dL
Specific Gravity, Urine: 1.016 (ref 1.005–1.030)
Squamous Epithelial / HPF: NONE SEEN (ref 0–5)
pH: 6 (ref 5.0–8.0)

## 2021-10-11 LAB — BASIC METABOLIC PANEL
Anion gap: 7 (ref 5–15)
BUN: 18 mg/dL (ref 8–23)
CO2: 26 mmol/L (ref 22–32)
Calcium: 9.1 mg/dL (ref 8.9–10.3)
Chloride: 103 mmol/L (ref 98–111)
Creatinine, Ser: 1.02 mg/dL (ref 0.61–1.24)
GFR, Estimated: >60 mL/min (ref >60)
Glucose, Bld: 132 mg/dL — ABNORMAL HIGH (ref 70–99)
Potassium: 3.7 mmol/L (ref 3.5–5.1)
Sodium: 136 mmol/L (ref 135–145)

## 2021-10-11 LAB — TSH: TSH: 2.162 u[IU]/mL (ref 0.350–4.500)

## 2021-10-11 LAB — RESP PANEL BY RT-PCR (FLU A&B, COVID) ARPGX2
Influenza A by PCR: NEGATIVE
Influenza B by PCR: NEGATIVE
SARS Coronavirus 2 by RT PCR: POSITIVE — AB

## 2021-10-11 LAB — MAGNESIUM: Magnesium: 2.2 mg/dL (ref 1.7–2.4)

## 2021-10-11 LAB — TROPONIN I (HIGH SENSITIVITY): Troponin I (High Sensitivity): 7 ng/L (ref <18)

## 2021-10-11 LAB — D-DIMER, QUANTITATIVE: D-Dimer, Quant: 1.43 ug/mL-FEU — ABNORMAL HIGH (ref 0.00–0.50)

## 2021-10-11 MED ORDER — AMLODIPINE BESYLATE 5 MG PO TABS
5.0000 mg | ORAL_TABLET | Freq: Every day | ORAL | Status: DC
  Administered 2021-10-11 – 2021-10-12 (×2): 5 mg via ORAL
  Filled 2021-10-11 (×2): qty 1

## 2021-10-11 MED ORDER — IOHEXOL 350 MG/ML SOLN
75.0000 mL | Freq: Once | INTRAVENOUS | Status: AC | PRN
  Administered 2021-10-11: 75 mL via INTRAVENOUS

## 2021-10-11 MED ORDER — ONDANSETRON HCL 4 MG PO TABS: 4.0000 mg | ORAL_TABLET | Freq: Four times a day (QID) | ORAL | Status: DC | PRN

## 2021-10-11 MED ORDER — SODIUM CHLORIDE 0.9 % IV SOLN: INTRAVENOUS | Status: DC

## 2021-10-11 MED ORDER — LACTATED RINGERS IV BOLUS
1000.0000 mL | Freq: Once | INTRAVENOUS | Status: AC
  Administered 2021-10-11: 1000 mL via INTRAVENOUS

## 2021-10-11 MED ORDER — ACETAMINOPHEN 325 MG PO TABS: 650.0000 mg | ORAL_TABLET | Freq: Four times a day (QID) | ORAL | Status: DC | PRN

## 2021-10-11 MED ORDER — ONDANSETRON HCL 4 MG/2ML IJ SOLN: 4.0000 mg | Freq: Four times a day (QID) | INTRAMUSCULAR | Status: DC | PRN

## 2021-10-11 MED ORDER — ACETAMINOPHEN 650 MG RE SUPP: 650.0000 mg | Freq: Four times a day (QID) | RECTAL | Status: DC | PRN

## 2021-10-11 MED ORDER — SIMVASTATIN 10 MG PO TABS
20.0000 mg | ORAL_TABLET | Freq: Every day | ORAL | Status: DC
  Filled 2021-10-11: qty 2

## 2021-10-11 MED ORDER — SODIUM CHLORIDE 0.9% FLUSH
3.0000 mL | Freq: Two times a day (BID) | INTRAVENOUS | Status: DC
  Administered 2021-10-12: 3 mL via INTRAVENOUS

## 2021-10-11 MED ORDER — ENOXAPARIN SODIUM 40 MG/0.4ML IJ SOSY
40.0000 mg | PREFILLED_SYRINGE | INTRAMUSCULAR | Status: DC
  Administered 2021-10-11: 40 mg via SUBCUTANEOUS
  Filled 2021-10-11: qty 0.4

## 2021-10-11 NOTE — ED Provider Triage Note (Signed)
Emergency Medicine Provider Triage Evaluation Note  CLEMONS SALVUCCI , a 66 y.o. male  was evaluated in triage.  Pt complains of  syncopal episode while in bathroom today. Wife reports LOC for 5 minutes and did hit his head on shower.    Review of Systems  Positive: + LOC,  Negative: No N/V   Physical Exam  BP (!) 146/84   Pulse (!) 119   Temp 98.1 F (36.7 C) (Oral)   Resp 18   Ht 5\' 9"  (1.753 m)   Wt 65.8 kg   SpO2 97%   BMI 21.41 kg/m  Gen:   Awake, no distress  Alert, talkative Resp:  Normal effort  MSK:   Moves extremities without difficulty. Able to stand and ambulate without assistance.   Other:    Medical Decision Making  Medically screening exam initiated at 2:58 PM.  Appropriate orders placed.  Taran Haynesworth Whisnant was informed that the remainder of the evaluation will be completed by another provider, this initial triage assessment does not replace that evaluation, and the importance of remaining in the ED until their evaluation is complete.     Johnn Hai, PA-C 10/11/21 1500

## 2021-10-11 NOTE — ED Triage Notes (Signed)
Pt arrives with c/o syncopal episode while he was in the bathroom. Pt did hit his head on the kip of the shower. Pt does not take blood thinners. Pt a&ox4 in triage.

## 2021-10-11 NOTE — ED Provider Notes (Signed)
[facility] Provider Note    Event Date/Time   First MD Initiated Contact with Patient 10/11/21 1656     (approximate)   History   Chief Complaint Loss of Consciousness   HPI  Raymond Petersen is a 66 y.o. male with past medical history of hypertension and hyperlipidemia who presents to the ED complaining of syncope.  Patient reports that he was brushing his teeth around 10:00 this morning when the next thing he knew he woke up on the ground.  He believes he passed out while brushing his teeth but denies any dizziness or lightheadedness preceding this.  He did not have any associated chest pain or shortness of breath, does feel like he fell back into the tub and struck his head with some pain around his occiput.  He denies any neck pain or other pain related to the fall.  He does report passing out twice earlier this year, which was attributed to taking Vicodin following shoulder surgery, had not had any episodes since stopping the Vicodin until today.  He does state those episodes seem different and that he had prodromal symptoms.     Physical Exam   Triage Vital Signs: ED Triage Vitals  Enc Vitals Group     BP 10/11/21 1445 (!) 146/84     Pulse Rate 10/11/21 1445 (!) 119     Resp 10/11/21 1445 18     Temp 10/11/21 1445 98.1 F (36.7 C)     Temp Source 10/11/21 1445 Oral     SpO2 10/11/21 1445 97 %     Weight 10/11/21 1447 145 lb (65.8 kg)     Height 10/11/21 1447 5\' 9"  (1.753 m)     Head Circumference --      Peak Flow --      Pain Score 10/11/21 1446 3     Pain Loc --      Pain Edu? --      Excl. in [place]? --     Most recent vital signs: Vitals:   10/11/21 1931 10/11/21 1934  BP:  (!) 161/96  Pulse:  (!) 101  Resp:  20  Temp: (P) 98.5 F (36.9 C) 98.6 F (37 C)  SpO2:  98%    Constitutional: Alert and oriented. Eyes: Conjunctivae are normal. Head: Atraumatic. Nose: No congestion/rhinnorhea. Mouth/Throat: Mucous membranes are  moist.  Neck: No midline cervical spine tenderness to palpation, able to rotate neck to 45 degrees Bilaterally without pain. Cardiovascular: Normal rate, regular rhythm. Grossly normal heart sounds.  2+ radial pulses bilaterally. Respiratory: Normal respiratory effort.  No retractions. Lungs CTAB. Gastrointestinal: Soft and nontender. No distention. Musculoskeletal: No lower extremity tenderness nor edema.  Neurologic:  Normal speech and language. No gross focal neurologic deficits are appreciated.    ED Results / Procedures / Treatments   Labs (all labs ordered are listed, but only abnormal results are displayed) Labs Reviewed  RESP PANEL BY RT-PCR (FLU A&B, COVID) ARPGX2 - Abnormal; Notable for the following components:      Result Value   SARS Coronavirus 2 by RT PCR POSITIVE (*)    All other components within normal limits  BASIC METABOLIC PANEL - Abnormal; Notable for the following components:   Glucose, Bld 132 (*)    All other components within normal limits  URINALYSIS, ROUTINE W REFLEX MICROSCOPIC - Abnormal; Notable for the following components:   Color, Urine YELLOW (*)    APPearance CLEAR (*)    Hgb urine  dipstick SMALL (*)    All other components within normal limits  D-DIMER, QUANTITATIVE (NOT AT [facility]) - Abnormal; Notable for the following components:   D-Dimer, Quant 1.43 (*)    All other components within normal limits  CBC  TSH  MAGNESIUM  CBG MONITORING, ED  TROPONIN I (HIGH SENSITIVITY)     EKG  ED ECG REPORT I, Chesley Noon, the attending physician, personally viewed and interpreted this ECG.   Date: 10/11/2021  EKG Time: 14:50  Rate: 116  Rhythm: sinus tachycardia  Axis: Normal  Intervals:none  ST&T Change: None  RADIOLOGY CT head reviewed and interpreted by me with no hemorrhage or midline shift.  PROCEDURES:  Critical Care performed: No  Procedures   MEDICATIONS ORDERED IN ED: Medications  lactated ringers bolus 1,000 mL (1,000  mLs Intravenous New Bag/Given 10/11/21 1823)  iohexol (OMNIPAQUE) 350 MG/ML injection 75 mL (75 mLs Intravenous Contrast Given 10/11/21 1859)     IMPRESSION / MDM / ASSESSMENT AND PLAN / ED COURSE  I reviewed the triage vital signs and the nursing notes.                              66 y.o. male with past medical history of hypertension and hyperlipidemia who presents to the ED complaining of syncopal episode while brushing his teeth earlier this morning, causing him to fall backwards and strike his head.  Patient's presentation is most consistent with acute presentation with potential threat to life or bodily function.  Differential diagnosis includes, but is not limited to, arrhythmia, ACS, PE, electrolyte abnormality, dehydration, vasovagal episode, intracranial injury, cervical spine injury.  Patient nontoxic-appearing and in no acute distress, vital signs remarkable for tachycardia but otherwise reassuring.  EKG shows sinus tachycardia with no ischemic changes, will observe on cardiac monitor and add on troponin.  Syncopal episode without prodromal symptoms is more concerning for cardiac etiology, will also add on D-dimer given his tachycardia, but no apparent risk factors for PE.  Labs thus far are reassuring with no significant anemia, leukocytosis, shot abnormality, or AKI.  We will also add on TSH and magnesium levels, hydrate with IV fluids and reassess.  CT head is negative for acute process related to his fall, no evidence of cervical spine injury on exam.  D-dimer elevated, CTA performed and negative for PE or other acute process.  Troponin within normal limits and I have low suspicion for ACS, patient remains sinus tach on the monitor.  TSH and magnesium levels are also unremarkable.  In further discussion with patient and family, there have been multiple family members with sudden cardiac death at an early age.  Patient and family offered admission for observation versus discharge  with close cardiology follow-up, prefer admission and case discussed with hospitalist.      FINAL CLINICAL IMPRESSION(S) / ED DIAGNOSES   Final diagnoses:  Syncope and collapse  COVID-19     Rx / DC Orders   ED Discharge Orders     None        Note:  This document was prepared using Dragon voice recognition software and may include unintentional dictation errors.   Chesley Noon, MD 10/11/21 1954

## 2021-10-11 NOTE — Assessment & Plan Note (Signed)
Etiology uncertain Syncope work-up to include continuous cardiac monitoring, echocardiogram and carotid Doppler Orthostatic checks Empiric IV hydration Neurologic checks

## 2021-10-11 NOTE — Assessment & Plan Note (Signed)
-   Continue home simvastatin 

## 2021-10-11 NOTE — H&P (Signed)
History and Physical    Patient: Raymond Petersen VQQ:[id number] DOB: 12/05/1955 DOA: 10/11/2021 DOS: the patient was seen and examined on 10/11/2021 PCP: Mylinda Latina, PA-C  Patient coming from: Home  Chief Complaint:  Chief Complaint  Patient presents with   Loss of Consciousness    HPI: Raymond Petersen is a 66 y.o. male with medical history significant for Hypertension and hyperlipidemia who presents to the ED for evaluation of an episode of loss of consciousness while brushing his teeth around 10 AM on 10/11/2021.  Patient states he had no warning signs no preceding lightheadedness, palpitations, visual disturbance or headache.  He had no chest pain or shortness of breath.  States he fell back and struck his head against the back of the top.  Had 2 episodes earlier in the year where he had a brief episode of passing out but he had a prodrome.  These episodes were attributed to Vicodin that he had been taking which she is not currently taking at this time.  He did have a recent cold with runny nose and sore throat but denies fever or chills, cough and congestion ED course and data review: Afebrile, BP 146/84 and pulse 119, respirations 18 with O2 sat 97% on room air: Labs significant for elevated D-dimer of 1.43 and positive COVID PCR, otherwise unremarkable.  EKG, personally viewed and interpreted shows sinus tachycardia at 116 with no acute ST-T wave changes.  CT head with no evidence of intracranial abnormality CTA chest showing no evidence of PE or active pulmonary disease Patient was given LR 1 L bolus in the ED and hospitalist consulted for admission.   Review of Systems: As mentioned in the history of present illness. All other systems reviewed and are negative.  Past Medical History:  Diagnosis Date   Hyperlipidemia    Hypertension    Past Surgical History:  Procedure Laterality Date   BACK SURGERY  2004   Social History:  reports that he has never smoked. He  has never used smokeless tobacco. He reports that he does not drink alcohol and does not use drugs.  Allergies  Allergen Reactions   Hydrocodone-Acetaminophen     Family History  Problem Relation Age of Onset   Diabetes Mother     Prior to Admission medications   Medication Sig Start Date End Date Taking? Authorizing Provider  amLODipine (NORVASC) 5 MG tablet Take 1 tablet (5 mg total) by mouth daily. 04/12/21  Yes Abernathy, Yetta Flock, NP  simvastatin (ZOCOR) 20 MG tablet Take 1 tablet (20 mg total) by mouth daily at 6 PM. 04/12/21  Yes Abernathy, Alyssa, NP  meloxicam (MOBIC) 15 MG tablet Take 1 tablet (15 mg total) by mouth daily. Patient not taking: Reported on 10/11/2021 09/19/20   Mylinda Latina, PA-C    Physical Exam: Vitals:   10/11/21 1445 10/11/21 1447 10/11/21 1931 10/11/21 1934  BP: (!) 146/84   (!) 161/96  Pulse: (!) 119   (!) 101  Resp: 18   20  Temp: 98.1 F (36.7 C)  (P) 98.5 F (36.9 C) 98.6 F (37 C)  TempSrc: Oral  (P) Oral Oral  SpO2: 97%   98%  Weight:  65.8 kg    Height:  5\' 9"  (1.753 m)     Physical Exam Vitals and nursing note reviewed.  Constitutional:      General: He is not in acute distress. HENT:     Head: Normocephalic and atraumatic.  Cardiovascular:  Rate and Rhythm: Normal rate and regular rhythm.     Heart sounds: Normal heart sounds.  Pulmonary:     Effort: Pulmonary effort is normal.     Breath sounds: Normal breath sounds.  Abdominal:     Palpations: Abdomen is soft.     Tenderness: There is no abdominal tenderness.  Neurological:     Mental Status: Mental status is at baseline.     Labs on Admission: I have personally reviewed following labs and imaging studies  CBC: Recent Labs  Lab 10/11/21 1452  WBC 7.8  HGB 15.6  HCT 47.0  MCV 86.6  PLT 207   Basic Metabolic Panel: Recent Labs  Lab 10/11/21 1452  NA 136  K 3.7  CL 103  CO2 26  GLUCOSE 132*  BUN 18  CREATININE 1.02  CALCIUM 9.1  MG 2.2    GFR: Estimated Creatinine Clearance: 68.1 mL/min (by C-G formula based on SCr of 1.02 mg/dL). Liver Function Tests: No results for input(s): "AST", "ALT", "ALKPHOS", "BILITOT", "PROT", "ALBUMIN" in the last 168 hours. No results for input(s): "LIPASE", "AMYLASE" in the last 168 hours. No results for input(s): "AMMONIA" in the last 168 hours. Coagulation Profile: No results for input(s): "INR", "PROTIME" in the last 168 hours. Cardiac Enzymes: No results for input(s): "CKTOTAL", "CKMB", "CKMBINDEX", "TROPONINI" in the last 168 hours. BNP (last 3 results) No results for input(s): "PROBNP" in the last 8760 hours. HbA1C: No results for input(s): "HGBA1C" in the last 72 hours. CBG: No results for input(s): "GLUCAP" in the last 168 hours. Lipid Profile: No results for input(s): "CHOL", "HDL", "LDLCALC", "TRIG", "CHOLHDL", "LDLDIRECT" in the last 72 hours. Thyroid Function Tests: Recent Labs    10/11/21 1452  TSH 2.162   Anemia Panel: No results for input(s): "VITAMINB12", "FOLATE", "FERRITIN", "TIBC", "IRON", "RETICCTPCT" in the last 72 hours. Urine analysis:    Component Value Date/Time   COLORURINE YELLOW (A) 10/11/2021 1452   APPEARANCEUR CLEAR (A) 10/11/2021 1452   APPEARANCEUR Clear 04/12/2021 1552   LABSPEC 1.016 10/11/2021 1452   PHURINE 6.0 10/11/2021 1452   GLUCOSEU NEGATIVE 10/11/2021 1452   HGBUR SMALL (A) 10/11/2021 1452   BILIRUBINUR NEGATIVE 10/11/2021 1452   BILIRUBINUR Negative 04/12/2021 1552   KETONESUR NEGATIVE 10/11/2021 1452   PROTEINUR NEGATIVE 10/11/2021 1452   NITRITE NEGATIVE 10/11/2021 1452   LEUKOCYTESUR NEGATIVE 10/11/2021 1452    Radiological Exams on Admission: CT Angio Chest PE W/Cm &/Or Wo Cm  Result Date: 10/11/2021 CLINICAL DATA:  Pulmonary embolus suspected. Positive D-dimer. Syncopal episode. EXAM: CT ANGIOGRAPHY CHEST WITH CONTRAST TECHNIQUE: Multidetector CT imaging of the chest was performed using the standard protocol during bolus  administration of intravenous contrast. Multiplanar CT image reconstructions and MIPs were obtained to evaluate the vascular anatomy. RADIATION DOSE REDUCTION: This exam was performed according to the departmental dose-optimization program which includes automated exposure control, adjustment of the mA and/or kV according to patient size and/or use of iterative reconstruction technique. CONTRAST:  4mL OMNIPAQUE IOHEXOL 350 MG/ML SOLN COMPARISON:  11/21/2006 FINDINGS: Cardiovascular: Good opacification of the central and segmental pulmonary arteries. No focal filling defects. No evidence of significant pulmonary embolus. Normal heart size. No pericardial effusions. Normal caliber thoracic aorta. Calcification of the aorta and coronary arteries. Mediastinum/Nodes: Thyroid gland is unremarkable. Esophagus is decompressed. No significant lymphadenopathy. Lungs/Pleura: Mild dependent atelectasis in the lower lungs. No consolidation or edema. No pleural effusions. No pneumothorax. Upper Abdomen: No acute abnormalities. Musculoskeletal: Old left rib fractures. Review of the MIP images  confirms the above findings. IMPRESSION: 1. No evidence of significant pulmonary embolus. 2. No evidence of active pulmonary disease. Electronically Signed   By: Burman Nieves M.D.   On: 10/11/2021 19:09   CT HEAD WO CONTRAST  Result Date: 10/11/2021 CLINICAL DATA:  Syncope. EXAM: CT HEAD WITHOUT CONTRAST TECHNIQUE: Contiguous axial images were obtained from the base of the skull through the vertex without intravenous contrast. RADIATION DOSE REDUCTION: This exam was performed according to the departmental dose-optimization program which includes automated exposure control, adjustment of the mA and/or kV according to patient size and/or use of iterative reconstruction technique. COMPARISON:  None Available. FINDINGS: Brain: No evidence of acute infarction, hemorrhage, hydrocephalus, extra-axial collection or mass lesion/mass effect.  Vascular: No hyperdense vessel identified. Skull: No acute fracture. Sinuses/Orbits: Clear sinuses.  No acute orbital findings. Other: No mastoid effusions. IMPRESSION: No evidence of acute intracranial abnormality. Electronically Signed   By: Feliberto Harts M.D.   On: 10/11/2021 15:15     Data Reviewed: Relevant notes from primary care and specialist visits, past discharge summaries as available in EHR, including Care Everywhere. Prior diagnostic testing as pertinent to current admission diagnoses Updated medications and problem lists for reconciliation ED course, including vitals, labs, imaging, treatment and response to treatment Triage notes, nursing and pharmacy notes and ED provider's notes Notable results as noted in HPI   Assessment and Plan: * Recurrent syncope Etiology uncertain Syncope work-up to include continuous cardiac monitoring, echocardiogram and carotid Doppler Orthostatic checks Empiric IV hydration Neurologic checks  Mixed hyperlipidemia Continue home simvastatin  Essential hypertension, malignant BP slightly elevated at 161/96 Continue home amlodipine        DVT prophylaxis: Lovenox  Consults: none  Advance Care Planning:   Code Status: Full Code   Family Communication: none  Disposition Plan: Back to previous home environment  Severity of Illness: The appropriate patient status for this patient is OBSERVATION. Observation status is judged to be reasonable and necessary in order to provide the required intensity of service to ensure the patient's safety. The patient's presenting symptoms, physical exam findings, and initial radiographic and laboratory data in the context of their medical condition is felt to place them at decreased risk for further clinical deterioration. Furthermore, it is anticipated that the patient will be medically stable for discharge from the hospital within 2 midnights of admission.   Author: Andris Baumann,  MD 10/11/2021 8:00 PM  For on call review [email or web address].

## 2021-10-11 NOTE — Assessment & Plan Note (Signed)
BP slightly elevated at 161/96 Continue home amlodipine

## 2021-10-12 ENCOUNTER — Observation Stay (HOSPITAL_BASED_OUTPATIENT_CLINIC_OR_DEPARTMENT_OTHER)
Admit: 2021-10-12 | Discharge: 2021-10-12 | Disposition: A | Discharge status: Home / Self Care | Payer: BLUE CROSS/BLUE SHIELD | Attending: Internal Medicine | Admitting: Internal Medicine

## 2021-10-12 ENCOUNTER — Observation Stay: Payer: BLUE CROSS/BLUE SHIELD

## 2021-10-12 ENCOUNTER — Encounter: Payer: Self-pay | Admitting: Internal Medicine

## 2021-10-12 DIAGNOSIS — R55 Syncope and collapse: Secondary | ICD-10-CM | POA: Diagnosis not present

## 2021-10-12 LAB — ECHOCARDIOGRAM COMPLETE
AR max vel: 2.02 cm2
AV Area VTI: 2.07 cm2
AV Area mean vel: 1.87 cm2
AV Mean grad: 3 mmHg
AV Peak grad: 4.5 mmHg
Ao pk vel: 1.06 m/s
Area-P 1/2: 4.21 cm2
Height: 69 in
S' Lateral: 2.4 cm
Weight: 2320 oz

## 2021-10-12 LAB — CBG MONITORING, ED: Glucose-Capillary: 104 mg/dL — ABNORMAL HIGH (ref 70–99)

## 2021-10-12 NOTE — ED Notes (Signed)
Patient placed on hospital bed for comfort °

## 2021-10-12 NOTE — Progress Notes (Signed)
*  PRELIMINARY RESULTS* Echocardiogram 2D Echocardiogram has been performed.  Raymond Petersen 10/12/2021, 9:54 AM

## 2021-10-12 NOTE — Discharge Summary (Signed)
Physician Discharge Summary   Raymond Petersen  male DOB: [birth year]  SAY:[id number]  PCP: Mylinda Latina, PA-C  Admit date: 10/11/2021 Discharge date: 10/12/2021  Admitted From: home Disposition:  home Son updated at bedside prior to discharge. CODE STATUS: Full code   Hospital Course:  For full details, please see H&P, progress notes, consult notes and ancillary notes.  Briefly,  Raymond Petersen is a 66 y.o. male with medical history significant for Hypertension and hyperlipidemia who presented to the ED for evaluation of an episode of loss of consciousness while brushing his teeth around 10 AM on 10/11/2021 without any warning.  Pt had 2 episodes earlier in the year where he briefly passed out but he had a prodrome then. Those episodes were attributed to Vicodin that he was taking at the time which pt is not currently taking.  * Recurrent syncope Etiology uncertain BP mildly elevated on presentation.  Tele showed no acute finding.  Carotid US showed atherosclerosis but <50%, Echo unremarkable.  Pt received empiric IV hydration.  Pt was discharged on cardiac monitor and will follow up with Dr. Clayborn Bigness as outpatient in 1 month.   Mixed hyperlipidemia Continue home simvastatin   Essential hypertension BP slightly elevated  Continue home amlodipine   Discharge Diagnoses:  Principal Problem:   Recurrent syncope Active Problems:   Essential hypertension, malignant   Mixed hyperlipidemia     Discharge Instructions:  Allergies as of 10/12/2021       Reactions   Hydrocodone-acetaminophen         Medication List     STOP taking these medications    meloxicam 15 MG tablet Commonly known as: MOBIC       TAKE these medications    amLODipine 5 MG tablet Commonly known as: NORVASC Take 1 tablet (5 mg total) by mouth daily.   simvastatin 20 MG tablet Commonly known as: ZOCOR Take 1 tablet (20 mg total) by mouth daily at 6 PM.          Follow-up Information     Callwood, Dwayne D, MD. Go in 1 month(s).   Specialties: Cardiology, Internal Medicine Why: referral for syncope, follow up 30 day heart monitor Contact information: [address] [place] [address] [phone]         Mylinda Latina, PA-C Follow up in 1 week(s).   Specialty: Physician Assistant Contact information: [address] [place] [address] [phone]                 Allergies  Allergen Reactions   Hydrocodone-Acetaminophen      The results of significant diagnostics from this hospitalization (including imaging, microbiology, ancillary and laboratory) are listed below for reference.   Consultations:   Procedures/Studies: US Carotid Bilateral  Result Date: 10/12/2021 CLINICAL DATA:  66 year old male with syncope. EXAM: BILATERAL CAROTID DUPLEX ULTRASOUND TECHNIQUE: Pearline Cables scale imaging, color Doppler and duplex ultrasound were performed of bilateral carotid and vertebral arteries in the neck. COMPARISON:  Head CT 10/11/2021. FINDINGS: Criteria: Quantification of carotid stenosis is based on velocity parameters that correlate the residual internal carotid diameter with NASCET-based stenosis levels, using the diameter of the distal internal carotid lumen as the denominator for stenosis measurement. The following velocity measurements were obtained: RIGHT ICA: 73 cm/sec CCA: 073 cm/sec SYSTOLIC ICA/CCA RATIO:  0.7 ECA: 103 cm/sec LEFT ICA: 69 cm/sec CCA: 92 cm/sec SYSTOLIC ICA/CCA RATIO:  0.8 ECA: 174 cm/sec RIGHT CAROTID ARTERY: Minor atherosclerotic plaque visible at the right  ICA bulb (image 15). More pronounced shadowing plaque visible at the proximal ECA (image 20). RIGHT VERTEBRAL ARTERY:  Patent with antegrade flow. LEFT CAROTID ARTERY: Mild plaque visible in the left CCA (image 38). Mild plaque at the left ICA bulb. Similar to the right side, somewhat more pronounced plaque visible at the proximal ECA. LEFT  VERTEBRAL ARTERY:  Patent with antegrade flow. IMPRESSION: Bilateral cervical carotid atherosclerosis, but less than 50% stenosis by Doppler ultrasound criteria. Electronically Signed   By: Odessa Fleming M.D.   On: 10/12/2021 04:39   CT Angio Chest PE W/Cm &/Or Wo Cm  Result Date: 10/11/2021 CLINICAL DATA:  Pulmonary embolus suspected. Positive D-dimer. Syncopal episode. EXAM: CT ANGIOGRAPHY CHEST WITH CONTRAST TECHNIQUE: Multidetector CT imaging of the chest was performed using the standard protocol during bolus administration of intravenous contrast. Multiplanar CT image reconstructions and MIPs were obtained to evaluate the vascular anatomy. RADIATION DOSE REDUCTION: This exam was performed according to the departmental dose-optimization program which includes automated exposure control, adjustment of the mA and/or kV according to patient size and/or use of iterative reconstruction technique. CONTRAST:  79mL OMNIPAQUE IOHEXOL 350 MG/ML SOLN COMPARISON:  11/21/2006 FINDINGS: Cardiovascular: Good opacification of the central and segmental pulmonary arteries. No focal filling defects. No evidence of significant pulmonary embolus. Normal heart size. No pericardial effusions. Normal caliber thoracic aorta. Calcification of the aorta and coronary arteries. Mediastinum/Nodes: Thyroid gland is unremarkable. Esophagus is decompressed. No significant lymphadenopathy. Lungs/Pleura: Mild dependent atelectasis in the lower lungs. No consolidation or edema. No pleural effusions. No pneumothorax. Upper Abdomen: No acute abnormalities. Musculoskeletal: Old left rib fractures. Review of the MIP images confirms the above findings. IMPRESSION: 1. No evidence of significant pulmonary embolus. 2. No evidence of active pulmonary disease. Electronically Signed   By: Burman Nieves M.D.   On: 10/11/2021 19:09   CT HEAD WO CONTRAST  Result Date: 10/11/2021 CLINICAL DATA:  Syncope. EXAM: CT HEAD WITHOUT CONTRAST TECHNIQUE:  Contiguous axial images were obtained from the base of the skull through the vertex without intravenous contrast. RADIATION DOSE REDUCTION: This exam was performed according to the departmental dose-optimization program which includes automated exposure control, adjustment of the mA and/or kV according to patient size and/or use of iterative reconstruction technique. COMPARISON:  None Available. FINDINGS: Brain: No evidence of acute infarction, hemorrhage, hydrocephalus, extra-axial collection or mass lesion/mass effect. Vascular: No hyperdense vessel identified. Skull: No acute fracture. Sinuses/Orbits: Clear sinuses.  No acute orbital findings. Other: No mastoid effusions. IMPRESSION: No evidence of acute intracranial abnormality. Electronically Signed   By: Feliberto Harts M.D.   On: 10/11/2021 15:15      Labs: BNP (last 3 results) No results for input(s): "BNP" in the last 8760 hours. Basic Metabolic Panel: Recent Labs  Lab 10/11/21 1452  NA 136  K 3.7  CL 103  CO2 26  GLUCOSE 132*  BUN 18  CREATININE 1.02  CALCIUM 9.1  MG 2.2   Liver Function Tests: No results for input(s): "AST", "ALT", "ALKPHOS", "BILITOT", "PROT", "ALBUMIN" in the last 168 hours. No results for input(s): "LIPASE", "AMYLASE" in the last 168 hours. No results for input(s): "AMMONIA" in the last 168 hours. CBC: Recent Labs  Lab 10/11/21 1452  WBC 7.8  HGB 15.6  HCT 47.0  MCV 86.6  PLT 207   Cardiac Enzymes: No results for input(s): "CKTOTAL", "CKMB", "CKMBINDEX", "TROPONINI" in the last 168 hours. BNP: Invalid input(s): "POCBNP" CBG: Recent Labs  Lab 10/12/21 0559  GLUCAP 104*   D-Dimer  Recent Labs    10/11/21 1820  DDIMER 1.43*   Hgb A1c No results for input(s): "HGBA1C" in the last 72 hours. Lipid Profile No results for input(s): "CHOL", "HDL", "LDLCALC", "TRIG", "CHOLHDL", "LDLDIRECT" in the last 72 hours. Thyroid function studies Recent Labs    10/11/21 1452  TSH 2.162   Anemia  work up No results for input(s): "VITAMINB12", "FOLATE", "FERRITIN", "TIBC", "IRON", "RETICCTPCT" in the last 72 hours. Urinalysis    Component Value Date/Time   COLORURINE YELLOW (A) 10/11/2021 1452   APPEARANCEUR CLEAR (A) 10/11/2021 1452   APPEARANCEUR Clear 04/12/2021 1552   LABSPEC 1.016 10/11/2021 1452   PHURINE 6.0 10/11/2021 1452   GLUCOSEU NEGATIVE 10/11/2021 1452   HGBUR SMALL (A) 10/11/2021 1452   BILIRUBINUR NEGATIVE 10/11/2021 1452   BILIRUBINUR Negative 04/12/2021 1552   KETONESUR NEGATIVE 10/11/2021 1452   PROTEINUR NEGATIVE 10/11/2021 1452   NITRITE NEGATIVE 10/11/2021 1452   LEUKOCYTESUR NEGATIVE 10/11/2021 1452   Sepsis Labs Recent Labs  Lab 10/11/21 1452  WBC 7.8   Microbiology Recent Results (from the past 240 hour(s))  Resp Panel by RT-PCR (Flu A&B, Covid) Anterior Nasal Swab     Status: Abnormal   Collection Time: 10/11/21  6:25 PM   Specimen: Anterior Nasal Swab  Result Value Ref Range Status   SARS Coronavirus 2 by RT PCR POSITIVE (A) NEGATIVE Final    Comment: (NOTE) SARS-CoV-2 target nucleic acids are DETECTED.  The SARS-CoV-2 RNA is generally detectable in upper respiratory specimens during the acute phase of infection. Positive results are indicative of the presence of the identified virus, but do not rule out bacterial infection or co-infection with other pathogens not detected by the test. Clinical correlation with patient history and other diagnostic information is necessary to determine patient infection status. The expected result is Negative.  Fact Sheet for Patients: BloggerCourse.com  Fact Sheet for Healthcare Providers: SeriousBroker.it  This test is not yet approved or cleared by the [place] FDA and  has been authorized for detection and/or diagnosis of SARS-CoV-2 by FDA under an Emergency Use Authorization (EUA).  This EUA will remain in effect (meaning this test can be  used) for the duration of  the COVID-19 declaration under Section 564(b)(1) of the A ct, 21 U.S.C. section 360bbb-3(b)(1), unless the authorization is terminated or revoked sooner.     Influenza A by PCR NEGATIVE NEGATIVE Final   Influenza B by PCR NEGATIVE NEGATIVE Final    Comment: (NOTE) The Xpert Xpress SARS-CoV-2/FLU/RSV plus assay is intended as an aid in the diagnosis of influenza from Nasopharyngeal swab specimens and should not be used as a sole basis for treatment. Nasal washings and aspirates are unacceptable for Xpert Xpress SARS-CoV-2/FLU/RSV testing.  Fact Sheet for Patients: BloggerCourse.com  Fact Sheet for Healthcare Providers: SeriousBroker.it  This test is not yet approved or cleared by the [place] FDA and has been authorized for detection and/or diagnosis of SARS-CoV-2 by FDA under an Emergency Use Authorization (EUA). This EUA will remain in effect (meaning this test can be used) for the duration of the COVID-19 declaration under Section 564(b)(1) of the Act, 21 U.S.C. section 360bbb-3(b)(1), unless the authorization is terminated or revoked.  Performed at [facility], [address] [place], [place] [address]      Total time spend on discharging this patient, including the last patient exam, discussing the hospital stay, instructions for ongoing care as it relates to all pertinent caregivers, as well as preparing the medical discharge records,  prescriptions, and/or referrals as applicable, is 45 minutes.    Darlin Priestly, MD  [facility] 10/12/2021, 11:55 AM

## 2021-10-13 LAB — HIV ANTIBODY (ROUTINE TESTING W REFLEX): HIV Screen 4th Generation wRfx: NONREACTIVE

## 2022-03-09 ENCOUNTER — Other Ambulatory Visit: Payer: Self-pay | Admitting: Nurse Practitioner

## 2022-03-09 DIAGNOSIS — E782 Mixed hyperlipidemia: Secondary | ICD-10-CM

## 2022-04-18 ENCOUNTER — Encounter: Payer: Self-pay | Admitting: Nurse Practitioner

## 2022-04-18 ENCOUNTER — Ambulatory Visit (INDEPENDENT_AMBULATORY_CARE_PROVIDER_SITE_OTHER): Payer: Medicare HMO | Admitting: Nurse Practitioner

## 2022-04-18 VITALS — BP 140/80 | HR 96 | Temp 98.4°F | Resp 16 | Ht 69.0 in | Wt 148.6 lb

## 2022-04-18 DIAGNOSIS — R7989 Other specified abnormal findings of blood chemistry: Secondary | ICD-10-CM | POA: Diagnosis not present

## 2022-04-18 DIAGNOSIS — Z125 Encounter for screening for malignant neoplasm of prostate: Secondary | ICD-10-CM

## 2022-04-18 DIAGNOSIS — R7401 Elevation of levels of liver transaminase levels: Secondary | ICD-10-CM | POA: Diagnosis not present

## 2022-04-18 DIAGNOSIS — Z0001 Encounter for general adult medical examination with abnormal findings: Secondary | ICD-10-CM | POA: Diagnosis not present

## 2022-04-18 DIAGNOSIS — E782 Mixed hyperlipidemia: Secondary | ICD-10-CM | POA: Diagnosis not present

## 2022-04-18 DIAGNOSIS — R300 Dysuria: Secondary | ICD-10-CM

## 2022-04-18 DIAGNOSIS — I10 Essential (primary) hypertension: Secondary | ICD-10-CM | POA: Diagnosis not present

## 2022-04-18 MED ORDER — AMLODIPINE BESYLATE 5 MG PO TABS: 5.0000 mg | ORAL_TABLET | Freq: Every day | ORAL | 3 refills | Status: DC

## 2022-04-18 NOTE — Progress Notes (Signed)
[facility] [address] [place], [place] [address]  Internal MEDICINE  Office Visit Note  Patient Name: Raymond Petersen  [id number]  [id number]  Date of Service: 04/18/2022  Chief Complaint  Patient presents with   Hypertension   Hyperlipidemia   Annual Exam    HPI Raymond Petersen presents for an annual well visit and physical exam.  Well-appearing 67 y.o. male with hypertension, high cholesterol, and GERD Routine CRC screening: deferred.  Labs: due for routine labs.  New or worsening pain: none Other concerns: none Due for PSA screening     Current Medication: Outpatient Encounter Medications as of 04/18/2022  Medication Sig   simvastatin (ZOCOR) 20 MG tablet TAKE 1 TABLET(20 MG) BY MOUTH DAILY AT 6 PM   [DISCONTINUED] amLODipine (NORVASC) 5 MG tablet Take 1 tablet (5 mg total) by mouth daily.   amLODipine (NORVASC) 5 MG tablet Take 1 tablet (5 mg total) by mouth daily.   No facility-administered encounter medications on file as of 04/18/2022.    Surgical History: Past Surgical History:  Procedure Laterality Date   BACK SURGERY  2004    Medical History: Past Medical History:  Diagnosis Date   Hyperlipidemia    Hypertension     Family History: Family History  Problem Relation Age of Onset   Diabetes Mother     Social History   Socioeconomic History   Marital status: Married    Spouse name: Not on file   Number of children: Not on file   Years of education: Not on file   Highest education level: Not on file  Occupational History   Not on file  Tobacco Use   Smoking status: Never   Smokeless tobacco: Never  Substance and Sexual Activity   Alcohol use: No   Drug use: No   Sexual activity: Yes  Other Topics Concern   Not on file  Social History Narrative   Not on file   Social Determinants of Health   Financial Resource Strain: Not on file  Food Insecurity: Not on file  Transportation Needs: Not on file  Physical Activity: Not on  file  Stress: Not on file  Social Connections: Not on file  Intimate Partner Violence: Not on file      Review of Systems  Constitutional:  Negative for activity change, appetite change, chills, fatigue, fever and unexpected weight change.  HENT: Negative.  Negative for congestion, ear pain, rhinorrhea, sore throat and trouble swallowing.   Eyes: Negative.   Respiratory: Negative.  Negative for cough, chest tightness, shortness of breath and wheezing.   Cardiovascular: Negative.  Negative for chest pain.  Gastrointestinal: Negative.  Negative for abdominal pain, blood in stool, constipation, diarrhea, nausea and vomiting.  Endocrine: Negative.   Genitourinary: Negative.  Negative for difficulty urinating, dysuria, frequency, hematuria and urgency.  Musculoskeletal: Negative.  Negative for arthralgias, back pain, joint swelling, myalgias and neck pain.  Skin: Negative.  Negative for rash and wound.  Allergic/Immunologic: Negative.  Negative for immunocompromised state.  Neurological: Negative.  Negative for dizziness, seizures, numbness and headaches.  Hematological: Negative.   Psychiatric/Behavioral: Negative.  Negative for behavioral problems, self-injury and suicidal ideas. The patient is not nervous/anxious.     Vital Signs: BP (!) 154/83   Pulse 96   Temp 98.4 F (36.9 C)   Resp 16   Ht 5\' 9"  (1.753 m)   Wt 148 lb 9.6 oz (67.4 kg)   SpO2 98%   BMI 21.94 kg/m  Physical Exam Vitals reviewed.  Constitutional:      General: He is awake. He is not in acute distress.    Appearance: Normal appearance. He is well-developed, well-groomed and normal weight. He is not ill-appearing or diaphoretic.  HENT:     Head: Normocephalic and atraumatic.     Right Ear: Tympanic membrane, ear canal and external ear normal.     Left Ear: Tympanic membrane, ear canal and external ear normal.     Nose: Nose normal.     Mouth/Throat:     Mouth: Mucous membranes are moist.     Pharynx:  Oropharynx is clear. No oropharyngeal exudate or posterior oropharyngeal erythema.  Eyes:     General: No scleral icterus.       Right eye: No discharge.        Left eye: No discharge.     Extraocular Movements: Extraocular movements intact.     Conjunctiva/sclera: Conjunctivae normal.     Pupils: Pupils are equal, round, and reactive to light.  Neck:     Thyroid: No thyromegaly.     Vascular: No carotid bruit or JVD.     Trachea: Trachea and phonation normal. No tracheal deviation.  Cardiovascular:     Rate and Rhythm: Normal rate and regular rhythm.     Pulses: Normal pulses.     Heart sounds: Normal heart sounds, S1 normal and S2 normal. No murmur heard.    No friction rub. No gallop.  Pulmonary:     Effort: Pulmonary effort is normal. No accessory muscle usage or respiratory distress.     Breath sounds: Normal breath sounds and air entry. No stridor. No wheezing or rales.  Chest:     Chest wall: No tenderness.  Abdominal:     General: Bowel sounds are normal. There is no distension.     Palpations: Abdomen is soft. There is no shifting dullness, fluid wave, mass or pulsatile mass.     Tenderness: There is no abdominal tenderness. There is no guarding or rebound.  Musculoskeletal:        General: No tenderness or deformity. Normal range of motion.     Cervical back: Normal range of motion and neck supple.     Right lower leg: No edema.     Left lower leg: No edema.  Lymphadenopathy:     Cervical: No cervical adenopathy.  Skin:    General: Skin is warm and dry.     Capillary Refill: Capillary refill takes less than 2 seconds.     Coloration: Skin is not pale.     Findings: No erythema or rash.  Neurological:     Mental Status: He is alert and oriented to person, place, and time.     Cranial Nerves: No cranial nerve deficit.     Motor: No abnormal muscle tone.     Coordination: Coordination normal.     Gait: Gait normal.     Deep Tendon Reflexes: Reflexes are normal and  symmetric.  Psychiatric:        Mood and Affect: Mood normal.        Behavior: Behavior normal. Behavior is cooperative.        Thought Content: Thought content normal.        Judgment: Judgment normal.        Assessment/Plan: 1. Encounter for routine adult health examination with abnormal findings Age-appropriate preventive screenings and vaccinations discussed, annual physical exam completed. Routine labs for health maintenance ordered, see below. PHM updated.  2. Mixed hyperlipidemia Routine labs ordered - CBC with Differential/Platelet - CMP14+EGFR - Lipid Profile  3. Essential hypertension Continue amlodipine as prescribed.  - amLODipine (NORVASC) 5 MG tablet; Take 1 tablet (5 mg total) by mouth daily.  Dispense: 90 tablet; Refill: 3  4. Elevated AST (SGOT) Routine labs ordered - CBC with Differential/Platelet - CMP14+EGFR  5. Elevated TSH Routine labs ordered - CBC with Differential/Platelet - TSH + free T4  6. Screening for prostate cancer Routine lab ordered - PSA Total (Reflex To Free)  7. Dysuria Routine urinalysis done - UA/M w/rflx Culture, Routine - Microscopic Examination     General Counseling: caydan mctavish understanding of the findings of todays visit and agrees with plan of treatment. I have discussed any further diagnostic evaluation that may be needed or ordered today. We also reviewed his medications today. he has been encouraged to call the office with any questions or concerns that should arise related to todays visit.    Orders Placed This Encounter  Procedures   CBC with Differential/Platelet   CMP14+EGFR   Lipid Profile   TSH + free T4   PSA Total (Reflex To Free)    Meds ordered this encounter  Medications   amLODipine (NORVASC) 5 MG tablet    Sig: Take 1 tablet (5 mg total) by mouth daily.    Dispense:  90 tablet    Refill:  3    Return for CPE, Henleigh Robello PCP and otherwise as needed. .   Total time spent:30  Minutes Time spent includes review of chart, medications, test results, and follow up plan with the patient.   [place] Controlled Substance Database was reviewed by me.  This patient was seen by Sallyanne Kuster, FNP-C in collaboration with Dr. Beverely Risen as a part of collaborative care agreement.  Elica Almas R. Tedd Sias, MSN, FNP-C Internal medicine

## 2022-04-19 LAB — UA/M W/RFLX CULTURE, ROUTINE
Bilirubin, UA: NEGATIVE
Glucose, UA: NEGATIVE
Ketones, UA: NEGATIVE
Leukocytes,UA: NEGATIVE
Nitrite, UA: NEGATIVE
RBC, UA: NEGATIVE
Specific Gravity, UA: 1.023 (ref 1.005–1.030)
Urobilinogen, Ur: 0.2 mg/dL (ref 0.2–1.0)
pH, UA: 5.5 (ref 5.0–7.5)

## 2022-04-19 LAB — MICROSCOPIC EXAMINATION
Bacteria, UA: NONE SEEN
Casts: NONE SEEN /lpf
Epithelial Cells (non renal): NONE SEEN /hpf (ref 0–10)
WBC, UA: NONE SEEN /hpf (ref 0–5)

## 2022-04-20 LAB — CMP14+EGFR
ALT: 33 IU/L (ref 0–44)
AST: 32 IU/L (ref 0–40)
Albumin/Globulin Ratio: 1.6 (ref 1.2–2.2)
Albumin: 4.5 g/dL (ref 3.9–4.9)
Alkaline Phosphatase: 62 IU/L (ref 44–121)
BUN/Creatinine Ratio: 15 (ref 10–24)
BUN: 17 mg/dL (ref 8–27)
Bilirubin Total: 0.5 mg/dL (ref 0.0–1.2)
CO2: 23 mmol/L (ref 20–29)
Calcium: 9.7 mg/dL (ref 8.6–10.2)
Chloride: 101 mmol/L (ref 96–106)
Creatinine, Ser: 1.1 mg/dL (ref 0.76–1.27)
Globulin, Total: 2.9 g/dL (ref 1.5–4.5)
Glucose: 115 mg/dL — ABNORMAL HIGH (ref 70–99)
Potassium: 4.3 mmol/L (ref 3.5–5.2)
Sodium: 138 mmol/L (ref 134–144)
Total Protein: 7.4 g/dL (ref 6.0–8.5)
eGFR: 75 mL/min/{1.73_m2} (ref >59)

## 2022-04-20 LAB — CBC WITH DIFFERENTIAL/PLATELET
Basophils Absolute: 0.1 10*3/uL (ref 0.0–0.2)
Basos: 1 %
EOS (ABSOLUTE): 0.2 10*3/uL (ref 0.0–0.4)
Eos: 2 %
Hematocrit: 48.3 % (ref 37.5–51.0)
Hemoglobin: 16.4 g/dL (ref 13.0–17.7)
Immature Grans (Abs): 0 10*3/uL (ref 0.0–0.1)
Immature Granulocytes: 0 %
Lymphocytes Absolute: 3.5 10*3/uL — ABNORMAL HIGH (ref 0.7–3.1)
Lymphs: 40 %
MCH: 29.5 pg (ref 26.6–33.0)
MCHC: 34 g/dL (ref 31.5–35.7)
MCV: 87 fL (ref 79–97)
Monocytes Absolute: 0.9 10*3/uL (ref 0.1–0.9)
Monocytes: 10 %
Neutrophils Absolute: 4.1 10*3/uL (ref 1.4–7.0)
Neutrophils: 47 %
Platelets: 243 10*3/uL (ref 150–450)
RBC: 5.55 x10E6/uL (ref 4.14–5.80)
RDW: 13 % (ref 11.6–15.4)
WBC: 8.6 10*3/uL (ref 3.4–10.8)

## 2022-04-20 LAB — LIPID PANEL
Chol/HDL Ratio: 4 ratio (ref 0.0–5.0)
Cholesterol, Total: 159 mg/dL (ref 100–199)
HDL: 40 mg/dL (ref >39)
LDL Chol Calc (NIH): 93 mg/dL (ref 0–99)
Triglycerides: 150 mg/dL — ABNORMAL HIGH (ref 0–149)
VLDL Cholesterol Cal: 26 mg/dL (ref 5–40)

## 2022-04-20 LAB — TSH+FREE T4
Free T4: 1.1 ng/dL (ref 0.82–1.77)
TSH: 5.03 u[IU]/mL — ABNORMAL HIGH (ref 0.450–4.500)

## 2022-04-20 LAB — PSA TOTAL (REFLEX TO FREE): Prostate Specific Ag, Serum: 1.2 ng/mL (ref 0.0–4.0)

## 2022-04-28 ENCOUNTER — Encounter: Payer: Self-pay | Admitting: Nurse Practitioner

## 2022-10-17 DIAGNOSIS — R69 Illness, unspecified: Secondary | ICD-10-CM | POA: Diagnosis not present

## 2022-10-19 ENCOUNTER — Encounter: Payer: Self-pay | Admitting: Nurse Practitioner

## 2022-10-19 ENCOUNTER — Ambulatory Visit (INDEPENDENT_AMBULATORY_CARE_PROVIDER_SITE_OTHER): Payer: Self-pay | Admitting: Nurse Practitioner

## 2022-10-19 VITALS — BP 134/82 | HR 95 | Temp 98.5°F | Resp 16 | Ht 69.0 in | Wt 145.4 lb

## 2022-10-19 DIAGNOSIS — R6889 Other general symptoms and signs: Secondary | ICD-10-CM

## 2022-10-19 DIAGNOSIS — T50B95A Adverse effect of other viral vaccines, initial encounter: Secondary | ICD-10-CM

## 2022-10-19 NOTE — Progress Notes (Signed)
[facility] [address] [place], [place] [address]  Internal MEDICINE  Office Visit Note  Patient Name: Raymond Petersen  [id number]  [id number]  Date of Service: 10/19/2022  Chief Complaint  Patient presents with   Acute Visit    Not feeling well after covid vaccine    HPI Antoneo presents for a follow-up visit for flu like symptoms Had covid and flu vaccine together at the same time, then developed flu like symptosm Covid vaccine is negative  Taking alkaseltzer but BP is a little elevated.     Current Medication: Outpatient Encounter Medications as of 10/19/2022  Medication Sig   amLODipine (NORVASC) 5 MG tablet Take 1 tablet (5 mg total) by mouth daily.   simvastatin (ZOCOR) 20 MG tablet TAKE 1 TABLET(20 MG) BY MOUTH DAILY AT 6 PM   No facility-administered encounter medications on file as of 10/19/2022.    Surgical History: Past Surgical History:  Procedure Laterality Date   BACK SURGERY  2004    Medical History: Past Medical History:  Diagnosis Date   Hyperlipidemia    Hypertension     Family History: Family History  Problem Relation Age of Onset   Diabetes Mother     Social History   Socioeconomic History   Marital status: Married    Spouse name: Not on file   Number of children: Not on file   Years of education: Not on file   Highest education level: Not on file  Occupational History   Not on file  Tobacco Use   Smoking status: Never   Smokeless tobacco: Never  Substance and Sexual Activity   Alcohol use: No   Drug use: No   Sexual activity: Yes  Other Topics Concern   Not on file  Social History Narrative   Not on file   Social Determinants of Health   Financial Resource Strain: Not on file  Food Insecurity: Not on file  Transportation Needs: Not on file  Physical Activity: Not on file  Stress: Not on file  Social Connections: Unknown (05/18/2021)   Received from [facility], [facility]   Social Network     Social Network: Not on file  Intimate Partner Violence: Unknown (05/18/2021)   Received from [facility], [facility]   HITS    Physically Hurt: Not on file    Insult or Talk Down To: Not on file    Threaten Physical Harm: Not on file    Scream or Curse: Not on file      Review of Systems  Constitutional:  Positive for chills and fatigue. Negative for fever and unexpected weight change.  HENT:  Positive for congestion, postnasal drip, sore throat and trouble swallowing. Negative for rhinorrhea, sinus pressure, sinus pain and sneezing.   Eyes:  Negative for redness.  Respiratory:  Positive for cough. Negative for chest tightness, shortness of breath and wheezing.   Cardiovascular: Negative.  Negative for chest pain and palpitations.  Gastrointestinal: Negative.  Negative for abdominal pain, constipation, diarrhea, nausea and vomiting.  Musculoskeletal:  Positive for myalgias (body aches). Negative for arthralgias, back pain, joint swelling and neck pain.  Psychiatric/Behavioral:  Behavioral problem: Depression.     Vital Signs: BP 134/82 Comment: 154/90  Pulse 95   Temp 98.5 F (36.9 C)   Resp 16   Ht 5\' 9"  (1.753 m)   Wt 145 lb 6.4 oz (66 kg)   SpO2 98%   BMI 21.47 kg/m    Physical Exam Constitutional:  General: He is not in acute distress.    Appearance: Normal appearance. He is normal weight. He is ill-appearing.  HENT:     Head: Normocephalic and atraumatic.     Right Ear: Tympanic membrane, ear canal and external ear normal.     Left Ear: Tympanic membrane, ear canal and external ear normal.     Nose: Nose normal. No congestion or rhinorrhea.     Mouth/Throat:     Mouth: Mucous membranes are moist.     Pharynx: Oropharynx is clear.  Eyes:     Pupils: Pupils are equal, round, and reactive to light.  Cardiovascular:     Rate and Rhythm: Normal rate and regular rhythm.     Heart sounds: Normal heart sounds. No murmur heard. Pulmonary:     Effort:  Pulmonary effort is normal. No respiratory distress.     Breath sounds: Normal breath sounds.  Neurological:     Mental Status: He is alert and oriented to person, place, and time.  Psychiatric:        Mood and Affect: Mood normal.        Behavior: Behavior normal.        Assessment/Plan: 1. Flu-like symptoms Continue to monitor self, rest, and may return to work as tolerated.   2. Adverse effect of influenza vaccine, initial encounter Most likely flu-like symptoms as a side effect of the flu vaccine recently received at the pharmacy. Patient received more than 1 vaccine at the same time which may have made the symptoms even worse. Encouraged patient to rest and monitor symptoms, call clinic if symptoms worsen or do not improve over the next several days.   General Counseling: emrys starosta understanding of the findings of todays visit and agrees with plan of treatment. I have discussed any further diagnostic evaluation that may be needed or ordered today. We also reviewed his medications today. he has been encouraged to call the office with any questions or concerns that should arise related to todays visit.    No orders of the defined types were placed in this encounter.   No orders of the defined types were placed in this encounter.   Return if symptoms worsen or fail to improve.   Total time spent:20 Minutes Time spent includes review of chart, medications, test results, and follow up plan with the patient.   [place] Controlled Substance Database was reviewed by me.  This patient was seen by Sallyanne Kuster, FNP-C in collaboration with Dr. Beverely Risen as a part of collaborative care agreement.   Eulogio Requena R. Tedd Sias, MSN, FNP-C Internal medicine

## 2022-10-24 DIAGNOSIS — R69 Illness, unspecified: Secondary | ICD-10-CM | POA: Diagnosis not present

## 2022-12-02 ENCOUNTER — Encounter: Payer: Self-pay | Admitting: Nurse Practitioner

## 2022-12-29 DIAGNOSIS — L03011 Cellulitis of right finger: Secondary | ICD-10-CM | POA: Diagnosis not present

## 2023-01-28 DIAGNOSIS — L400 Psoriasis vulgaris: Secondary | ICD-10-CM | POA: Diagnosis not present

## 2023-01-31 ENCOUNTER — Telehealth: Payer: Self-pay | Admitting: Nurse Practitioner

## 2023-01-31 NOTE — Telephone Encounter (Signed)
Lvm to move 04/25/23 appointment due to DB-Toni

## 2023-03-28 ENCOUNTER — Other Ambulatory Visit: Payer: Self-pay | Admitting: Nurse Practitioner

## 2023-03-28 DIAGNOSIS — E782 Mixed hyperlipidemia: Secondary | ICD-10-CM

## 2023-04-25 ENCOUNTER — Encounter: Payer: Self-pay | Admitting: Nurse Practitioner

## 2023-04-25 ENCOUNTER — Ambulatory Visit: Payer: Self-pay | Admitting: Nurse Practitioner

## 2023-04-25 VITALS — BP 132/80 | HR 84 | Temp 98.6°F | Resp 16 | Ht 69.0 in | Wt 147.2 lb

## 2023-04-25 DIAGNOSIS — I10 Essential (primary) hypertension: Secondary | ICD-10-CM

## 2023-04-25 DIAGNOSIS — R7401 Elevation of levels of liver transaminase levels: Secondary | ICD-10-CM

## 2023-04-25 DIAGNOSIS — Z0000 Encounter for general adult medical examination without abnormal findings: Secondary | ICD-10-CM | POA: Diagnosis not present

## 2023-04-25 DIAGNOSIS — R7989 Other specified abnormal findings of blood chemistry: Secondary | ICD-10-CM

## 2023-04-25 MED ORDER — AMLODIPINE BESYLATE 5 MG PO TABS: 5.0000 mg | ORAL_TABLET | Freq: Every day | ORAL | 3 refills | Status: AC

## 2023-04-25 NOTE — Progress Notes (Signed)
 [facility] [address] [place], [place] [address]  Internal MEDICINE  Office Visit Note  Patient Name: Raymond Petersen  [id number]  [id number]  Date of Service: 04/25/2023  Chief Complaint  Patient presents with   Hyperlipidemia   Hypertension   Medicare Wellness    HPI Raymond Petersen presents for a medicare annual wellness visit.  Well-appearing 68 y.o. male with hypertension, high cholesterol, and GERD. He takes amlodipine  for hypertension and simvastatin  for high cholesterol. He does not take any prescription medication for GERD. But sometimes takes OTC if needed.  Routine CRC screening: declined when offered.  Labs:  due for routine labs  New or worsening pain: none  Other concerns: none      04/25/2023    2:21 PM  MMSE - Mini Mental State Exam  Orientation to time 5  Orientation to Place 5  Registration 3  Attention/ Calculation 5  Recall 3  Language- name 2 objects 2  Language- repeat 1  Language- follow 3 step command 3  Language- read & follow direction 1  Write a sentence 1  Copy design 1  Total score 30    Functional Status Survey: Is the patient deaf or have difficulty hearing?: No Does the patient have difficulty seeing, even when wearing glasses/contacts?: No Does the patient have difficulty concentrating, remembering, or making decisions?: No Does the patient have difficulty walking or climbing stairs?: No Does the patient have difficulty dressing or bathing?: No Does the patient have difficulty doing errands alone such as visiting a doctor's office or shopping?: No     04/12/2021    1:54 PM 10/11/2021    2:51 PM 10/12/2021    7:36 AM 04/18/2022    1:53 PM 04/25/2023    2:20 PM  Fall Risk  Falls in the past year? 0   1 0  Was there an injury with Fall?    0 0  Fall Risk Category Calculator    1 0  (RETIRED) Patient Fall Risk Level  Low fall risk Low fall risk    Patient at Risk for Falls Due to     No Fall Risks  Fall risk Follow up     Falls evaluation completed Falls evaluation completed       04/25/2023    2:20 PM  Depression screen PHQ 2/9  Decreased Interest 0  Down, Depressed, Hopeless 0  PHQ - 2 Score 0        Current Medication: Outpatient Encounter Medications as of 04/25/2023  Medication Sig   simvastatin  (ZOCOR ) 20 MG tablet TAKE 1 TABLET(20 MG) BY MOUTH DAILY AT 6 PM   [DISCONTINUED] amLODipine  (NORVASC ) 5 MG tablet Take 1 tablet (5 mg total) by mouth daily.   amLODipine  (NORVASC ) 5 MG tablet Take 1 tablet (5 mg total) by mouth daily.   No facility-administered encounter medications on file as of 04/25/2023.    Surgical History: Past Surgical History:  Procedure Laterality Date   BACK SURGERY  2004    Medical History: Past Medical History:  Diagnosis Date   Hyperlipidemia    Hypertension     Family History: Family History  Problem Relation Age of Onset   Diabetes Mother     Social History   Socioeconomic History   Marital status: Married    Spouse name: Not on file   Number of children: Not on file   Years of education: Not on file   Highest education level: Not on file  Occupational History  Not on file  Tobacco Use   Smoking status: Never   Smokeless tobacco: Never  Substance and Sexual Activity   Alcohol use: No   Drug use: No   Sexual activity: Yes  Other Topics Concern   Not on file  Social History Narrative   Not on file   Social Drivers of Health   Financial Resource Strain: Not on file  Food Insecurity: Not on file  Transportation Needs: Not on file  Physical Activity: Not on file  Stress: Not on file  Social Connections: Unknown (05/18/2021)   Received [facility], [facility]   Social Network    Social Network: Not on file  Intimate Partner Violence: Unknown (05/18/2021)   Received from [facility], [facility]   HITS    Physically Hurt: Not on file    Insult or Talk Down To: Not on file    Threaten Physical Harm: Not on file     Scream or Curse: Not on file      Review of Systems  Constitutional:  Negative for activity change, appetite change, chills, fatigue, fever and unexpected weight change.  HENT: Negative.  Negative for congestion, ear pain, rhinorrhea, sore throat and trouble swallowing.   Eyes: Negative.   Respiratory: Negative.  Negative for cough, chest tightness, shortness of breath and wheezing.   Cardiovascular: Negative.  Negative for chest pain.  Gastrointestinal: Negative.  Negative for abdominal pain, blood in stool, constipation, diarrhea, nausea and vomiting.  Endocrine: Negative.   Genitourinary: Negative.  Negative for difficulty urinating, dysuria, frequency, hematuria and urgency.  Musculoskeletal: Negative.  Negative for arthralgias, back pain, joint swelling, myalgias and neck pain.  Skin: Negative.  Negative for rash and wound.  Allergic/Immunologic: Negative.  Negative for immunocompromised state.  Neurological: Negative.  Negative for dizziness, seizures, numbness and headaches.  Hematological: Negative.   Psychiatric/Behavioral: Negative.  Negative for behavioral problems, self-injury and suicidal ideas. The patient is not nervous/anxious.     Vital Signs: BP 132/80   Pulse 84   Temp 98.6 F (37 C)   Resp 16   Ht 5\' 9"  (1.753 m)   Wt 147 lb 3.2 oz (66.8 kg)   SpO2 99%   BMI 21.74 kg/m    Physical Exam Vitals reviewed.  Constitutional:      General: He is awake. He is not in acute distress.    Appearance: Normal appearance. He is well-developed, well-groomed and normal weight. He is not ill-appearing or diaphoretic.  HENT:     Head: Normocephalic and atraumatic.     Right Ear: Tympanic membrane, ear canal and external ear normal.     Left Ear: Tympanic membrane, ear canal and external ear normal.     Nose: Nose normal.     Mouth/Throat:     Mouth: Mucous membranes are moist.     Pharynx: Oropharynx is clear. No oropharyngeal exudate or posterior oropharyngeal  erythema.  Eyes:     General: No scleral icterus.       Right eye: No discharge.        Left eye: No discharge.     Extraocular Movements: Extraocular movements intact.     Conjunctiva/sclera: Conjunctivae normal.     Pupils: Pupils are equal, round, and reactive to light.  Neck:     Thyroid : No thyromegaly.     Vascular: No carotid bruit or JVD.     Trachea: Trachea and phonation normal. No tracheal deviation.  Cardiovascular:     Rate and  Rhythm: Normal rate and regular rhythm.     Pulses: Normal pulses.     Heart sounds: Normal heart sounds, S1 normal and S2 normal. No murmur heard.    No friction rub. No gallop.  Pulmonary:     Effort: Pulmonary effort is normal. No accessory muscle usage or respiratory distress.     Breath sounds: Normal breath sounds and air entry. No stridor. No wheezing or rales.  Chest:     Chest wall: No tenderness.  Abdominal:     General: Bowel sounds are normal. There is no distension.     Palpations: Abdomen is soft. There is no shifting dullness, fluid wave, mass or pulsatile mass.     Tenderness: There is no abdominal tenderness. There is no guarding or rebound.  Musculoskeletal:        General: No tenderness or deformity. Normal range of motion.     Cervical back: Normal range of motion and neck supple.     Right lower leg: No edema.     Left lower leg: No edema.  Lymphadenopathy:     Cervical: No cervical adenopathy.  Skin:    General: Skin is warm and dry.     Capillary Refill: Capillary refill takes less than 2 seconds.     Coloration: Skin is not pale.     Findings: No erythema or rash.  Neurological:     Mental Status: He is alert and oriented to person, place, and time.     Cranial Nerves: No cranial nerve deficit.     Motor: No abnormal muscle tone.     Coordination: Coordination normal.     Gait: Gait normal.     Deep Tendon Reflexes: Reflexes are normal and symmetric.  Psychiatric:        Mood and Affect: Mood normal.         Behavior: Behavior normal. Behavior is cooperative.        Thought Content: Thought content normal.        Judgment: Judgment normal.        Assessment/Plan: 1. Encounter for initial annual wellness visit (AWV) in Medicare patient (Primary) Age-appropriate preventive screenings and vaccinations discussed. Routine labs for health maintenance will be ordered. PHM updated.    2. Essential hypertension Stable, continue amlodipine  as prescribed.  - amLODipine  (NORVASC ) 5 MG tablet; Take 1 tablet (5 mg total) by mouth daily.  Dispense: 90 tablet; Refill: 3  3. Elevated TSH Will repeat labs   4. Elevated AST (SGOT) Will repeat labs       General Counseling: Raymond Petersen understanding of the findings of todays visit and agrees with plan of treatment. I have discussed any further diagnostic evaluation that may be needed or ordered today. We also reviewed his medications today. he has been encouraged to call the office with any questions or concerns that should arise related to todays visit.    No orders of the defined types were placed in this encounter.   Meds ordered this encounter  Medications   amLODipine  (NORVASC ) 5 MG tablet    Sig: Take 1 tablet (5 mg total) by mouth daily.    Dispense:  90 tablet    Refill:  3    Return in about 1 year (around 04/24/2024) for AWV, Shamiyah Ngu PCP and otherwise as needed .   Total time spent:30 Minutes Time spent includes review of chart, medications, test results, and follow up plan with the patient.   [place] Controlled Substance Database was reviewed  by me.  This patient was seen by Laurence Pons, FNP-C in collaboration with Dr. Verneta Gone as a part of collaborative care agreement.  Raymond Oakley R. Bobbi Burow, MSN, FNP-C Internal medicine

## 2023-04-26 ENCOUNTER — Other Ambulatory Visit: Payer: Self-pay | Admitting: Nurse Practitioner

## 2023-04-26 DIAGNOSIS — R7401 Elevation of levels of liver transaminase levels: Secondary | ICD-10-CM | POA: Diagnosis not present

## 2023-04-26 DIAGNOSIS — E782 Mixed hyperlipidemia: Secondary | ICD-10-CM | POA: Diagnosis not present

## 2023-04-26 DIAGNOSIS — I10 Essential (primary) hypertension: Secondary | ICD-10-CM | POA: Diagnosis not present

## 2023-04-26 DIAGNOSIS — R7989 Other specified abnormal findings of blood chemistry: Secondary | ICD-10-CM | POA: Diagnosis not present

## 2023-04-26 DIAGNOSIS — Z125 Encounter for screening for malignant neoplasm of prostate: Secondary | ICD-10-CM | POA: Diagnosis not present

## 2023-04-27 LAB — CBC WITH DIFFERENTIAL/PLATELET
Basophils Absolute: 0.1 10*3/uL (ref 0.0–0.2)
Basos: 1 %
EOS (ABSOLUTE): 0.1 10*3/uL (ref 0.0–0.4)
Eos: 2 %
Hematocrit: 44.1 % (ref 37.5–51.0)
Hemoglobin: 14.7 g/dL (ref 13.0–17.7)
Immature Grans (Abs): 0 10*3/uL (ref 0.0–0.1)
Immature Granulocytes: 0 %
Lymphocytes Absolute: 2.9 10*3/uL (ref 0.7–3.1)
Lymphs: 42 %
MCH: 28.9 pg (ref 26.6–33.0)
MCHC: 33.3 g/dL (ref 31.5–35.7)
MCV: 87 fL (ref 79–97)
Monocytes Absolute: 0.8 10*3/uL (ref 0.1–0.9)
Monocytes: 12 %
Neutrophils Absolute: 2.9 10*3/uL (ref 1.4–7.0)
Neutrophils: 43 %
Platelets: 239 10*3/uL (ref 150–450)
RBC: 5.08 x10E6/uL (ref 4.14–5.80)
RDW: 13.4 % (ref 11.6–15.4)
WBC: 6.8 10*3/uL (ref 3.4–10.8)

## 2023-04-27 LAB — COMPREHENSIVE METABOLIC PANEL WITH GFR
ALT: 29 IU/L (ref 0–44)
AST: 32 IU/L (ref 0–40)
Albumin: 4.2 g/dL (ref 3.9–4.9)
Alkaline Phosphatase: 66 IU/L (ref 44–121)
BUN/Creatinine Ratio: 20 (ref 10–24)
BUN: 20 mg/dL (ref 8–27)
Bilirubin Total: 0.4 mg/dL (ref 0.0–1.2)
CO2: 24 mmol/L (ref 20–29)
Calcium: 9.1 mg/dL (ref 8.6–10.2)
Chloride: 103 mmol/L (ref 96–106)
Creatinine, Ser: 1 mg/dL (ref 0.76–1.27)
Globulin, Total: 2.6 g/dL (ref 1.5–4.5)
Glucose: 101 mg/dL — ABNORMAL HIGH (ref 70–99)
Potassium: 4.3 mmol/L (ref 3.5–5.2)
Sodium: 139 mmol/L (ref 134–144)
Total Protein: 6.8 g/dL (ref 6.0–8.5)
eGFR: 84 mL/min/{1.73_m2} (ref >59)

## 2023-04-27 LAB — T4, FREE: Free T4: 0.98 ng/dL (ref 0.82–1.77)

## 2023-04-27 LAB — LIPID PANEL
Chol/HDL Ratio: 3.9 ratio (ref 0.0–5.0)
Cholesterol, Total: 135 mg/dL (ref 100–199)
HDL: 35 mg/dL — ABNORMAL LOW (ref >39)
LDL Chol Calc (NIH): 79 mg/dL (ref 0–99)
Triglycerides: 115 mg/dL (ref 0–149)
VLDL Cholesterol Cal: 21 mg/dL (ref 5–40)

## 2023-04-27 LAB — TSH: TSH: 4.76 u[IU]/mL — ABNORMAL HIGH (ref 0.450–4.500)

## 2023-04-27 LAB — PSA: Prostate Specific Ag, Serum: 1.2 ng/mL (ref 0.0–4.0)

## 2023-05-29 ENCOUNTER — Encounter: Payer: Self-pay | Admitting: Nurse Practitioner

## 2023-10-28 ENCOUNTER — Ambulatory Visit: Payer: Self-pay

## 2023-10-28 DIAGNOSIS — R7611 Nonspecific reaction to tuberculin skin test without active tuberculosis: Secondary | ICD-10-CM

## 2023-10-28 DIAGNOSIS — Z9229 Personal history of other drug therapy: Secondary | ICD-10-CM

## 2023-10-28 DIAGNOSIS — Z111 Encounter for screening for respiratory tuberculosis: Secondary | ICD-10-CM

## 2023-10-30 ENCOUNTER — Ambulatory Visit
Admission: RE | Admit: 2023-10-30 | Discharge: 2023-10-30 | Disposition: A | Discharge status: Home / Self Care | Source: Ambulatory Visit | Attending: Nurse Practitioner | Admitting: Nurse Practitioner

## 2023-10-30 ENCOUNTER — Ambulatory Visit
Admission: RE | Admit: 2023-10-30 | Discharge: 2023-10-30 | Disposition: A | Discharge status: Home / Self Care | Attending: Nurse Practitioner | Admitting: Nurse Practitioner

## 2023-10-30 ENCOUNTER — Ambulatory Visit

## 2023-10-30 DIAGNOSIS — R7611 Nonspecific reaction to tuberculin skin test without active tuberculosis: Secondary | ICD-10-CM | POA: Insufficient documentation

## 2023-10-30 DIAGNOSIS — Z9229 Personal history of other drug therapy: Secondary | ICD-10-CM | POA: Insufficient documentation

## 2023-10-30 DIAGNOSIS — R918 Other nonspecific abnormal finding of lung field: Secondary | ICD-10-CM | POA: Diagnosis not present

## 2023-10-30 LAB — TB SKIN TEST
Induration: 10 mm
TB Skin Test: POSITIVE

## 2023-10-30 NOTE — Progress Notes (Signed)
 Raymond Petersen will ordered chest x-ray

## 2023-11-05 ENCOUNTER — Ambulatory Visit: Payer: Self-pay | Admitting: Internal Medicine

## 2023-11-12 ENCOUNTER — Telehealth: Payer: Self-pay | Admitting: Internal Medicine

## 2023-11-12 ENCOUNTER — Encounter: Payer: Self-pay | Admitting: Internal Medicine

## 2023-11-12 ENCOUNTER — Ambulatory Visit (INDEPENDENT_AMBULATORY_CARE_PROVIDER_SITE_OTHER): Admitting: Internal Medicine

## 2023-11-12 VITALS — BP 121/80 | HR 100 | Temp 98.3°F | Resp 16 | Ht 69.0 in | Wt 146.0 lb

## 2023-11-12 DIAGNOSIS — R9389 Abnormal findings on diagnostic imaging of other specified body structures: Secondary | ICD-10-CM

## 2023-11-12 DIAGNOSIS — R7611 Nonspecific reaction to tuberculin skin test without active tuberculosis: Secondary | ICD-10-CM | POA: Diagnosis not present

## 2023-11-12 DIAGNOSIS — R7989 Other specified abnormal findings of blood chemistry: Secondary | ICD-10-CM | POA: Diagnosis not present

## 2023-11-12 NOTE — Progress Notes (Signed)
 [facility] [address] [place], [place] [address]  Internal MEDICINE  Office Visit Note  Patient Name: Raymond Petersen  [id number]  [id number]  Date of Service: 11/12/2023  Chief Complaint  Patient presents with   Follow-up    Review CXR - HTN    HPI Pt is seen for follow up He has H/O positive PPD, he is from [place], but has been in [place]  [place] over [place] years  Positive PPD was followed by CXR which is abnormal, last CXR was done in 2012  Denies any sob or cough, fever or chills, no wt loss     Current Medication: Outpatient Encounter Medications as of 11/12/2023  Medication Sig   amLODipine  (NORVASC ) 5 MG tablet Take 1 tablet (5 mg total) by mouth daily.   simvastatin  (ZOCOR ) 20 MG tablet TAKE 1 TABLET(20 MG) BY MOUTH DAILY AT 6 PM   No facility-administered encounter medications on file as of 11/12/2023.    Surgical History: Past Surgical History:  Procedure Laterality Date   BACK SURGERY  2004    Medical History: Past Medical History:  Diagnosis Date   Hyperlipidemia    Hypertension     Family History: Family History  Problem Relation Age of Onset   Diabetes Mother     Social History   Socioeconomic History   Marital status: Married    Spouse name: Not on file   Number of children: Not on file   Years of education: Not on file   Highest education level: Not on file  Occupational History   Not on file  Tobacco Use   Smoking status: Never   Smokeless tobacco: Never  Substance and Sexual Activity   Alcohol use: No   Drug use: No   Sexual activity: Yes  Other Topics Concern   Not on file  Social History Narrative   Not on file   Social Drivers of Health   Financial Resource Strain: Not on file  Food Insecurity: Not on file  Transportation Needs: Not on file  Physical Activity: Not on file  Stress: Not on file  Social Connections: Unknown (05/18/2021)   Received from [facility]   Social Network    Social Network: Not on  file  Intimate Partner Violence: Unknown (05/18/2021)   Received from [facility]   HITS    Physically Hurt: Not on file    Insult or Talk Down To: Not on file    Threaten Physical Harm: Not on file    Scream or Curse: Not on file      Review of Systems  Constitutional:  Negative for fatigue and fever.  HENT:  Negative for congestion, mouth sores and postnasal drip.   Respiratory:  Negative for cough.   Cardiovascular:  Negative for chest pain.  Genitourinary:  Negative for flank pain.  Psychiatric/Behavioral: Negative.      Vital Signs: BP 121/80   Pulse 100   Temp 98.3 F (36.8 C)   Resp 16   Ht 5' 9 (1.753 m)   Wt 146 lb (66.2 kg)   SpO2 100%   BMI 21.56 kg/m    Physical Exam Constitutional:      Appearance: Normal appearance.  HENT:     Head: Normocephalic and atraumatic.     Nose: Nose normal.     Mouth/Throat:     Mouth: Mucous membranes are moist.     Pharynx: No posterior oropharyngeal erythema.  Eyes:     Extraocular Movements: Extraocular movements  intact.     Pupils: Pupils are equal, round, and reactive to light.  Cardiovascular:     Pulses: Normal pulses.     Heart sounds: Normal heart sounds.  Pulmonary:     Effort: Pulmonary effort is normal.     Breath sounds: Normal breath sounds.  Neurological:     General: No focal deficit present.     Mental Status: He is alert.  Psychiatric:        Mood and Affect: Mood normal.        Behavior: Behavior normal.        Assessment/Plan:  1. Abnormal CXR (Primary) IMPRESSION: Mild opacification over the mid to anterior aspect of the lungs just above the diaphragms on the lateral film which may be due to atelectasis or infection. 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 Elevated TSH Pt might need Synthroid, free  T4 will be checked on next visit     General Counseling: Narvis oakland understanding of the findings of todays visit and agrees with plan of treatment. I have discussed any further diagnostic evaluation that may be needed or ordered today. We also reviewed his medications today. he has been encouraged to call the office with any questions or concerns that should arise related to todays visit.    Orders Placed This Encounter  Procedures   CT Chest Wo Contrast    No orders of the defined types were placed in this encounter.   Total time spent:30 Minutes Time spent includes review of chart, medications, test results, and follow up plan with the patient.   [place] Controlled Substance Database was reviewed by me.   Dr Kesley Gaffey M Emalina Dubreuil Internal medicine

## 2023-11-12 NOTE — Telephone Encounter (Signed)
 Notified patient of CT appointment date, arrival time, location-Toni

## 2023-11-19 ENCOUNTER — Ambulatory Visit
Admission: RE | Admit: 2023-11-19 | Discharge: 2023-11-19 | Disposition: A | Discharge status: Home / Self Care | Source: Ambulatory Visit | Attending: Internal Medicine | Admitting: Internal Medicine

## 2023-11-19 DIAGNOSIS — R9389 Abnormal findings on diagnostic imaging of other specified body structures: Secondary | ICD-10-CM | POA: Diagnosis not present

## 2023-11-19 DIAGNOSIS — R918 Other nonspecific abnormal finding of lung field: Secondary | ICD-10-CM | POA: Diagnosis not present

## 2023-11-19 DIAGNOSIS — I700 Atherosclerosis of aorta: Secondary | ICD-10-CM | POA: Diagnosis not present

## 2023-12-10 ENCOUNTER — Encounter: Payer: Self-pay | Admitting: Internal Medicine

## 2023-12-10 ENCOUNTER — Ambulatory Visit: Admitting: Internal Medicine

## 2023-12-10 VITALS — BP 153/90 | HR 92 | Temp 98.0°F | Resp 16 | Ht 69.0 in | Wt 147.3 lb

## 2023-12-10 DIAGNOSIS — R7611 Nonspecific reaction to tuberculin skin test without active tuberculosis: Secondary | ICD-10-CM

## 2023-12-10 DIAGNOSIS — I10 Essential (primary) hypertension: Secondary | ICD-10-CM | POA: Diagnosis not present

## 2023-12-10 DIAGNOSIS — T50A95A Adverse effect of other bacterial vaccines, initial encounter: Secondary | ICD-10-CM | POA: Diagnosis not present

## 2023-12-10 DIAGNOSIS — R7989 Other specified abnormal findings of blood chemistry: Secondary | ICD-10-CM

## 2023-12-10 MED ORDER — BISOPROLOL-HYDROCHLOROTHIAZIDE 2.5-6.25 MG PO TABS: 1.0000 | ORAL_TABLET | Freq: Every day | ORAL | 2 refills | Status: AC

## 2023-12-10 NOTE — Progress Notes (Unsigned)
 [facility] [address] [place], [place] [address]  Internal MEDICINE  Office Visit Note  Patient Name: Raymond Petersen  [id number]  [id number]  Date of Service: 12/10/2023  Chief Complaint  Patient presents with   Follow-up    Review chest CT- HTN, Hyperlipidemia    HPI     Current Medication: Outpatient Encounter Medications as of 12/10/2023  Medication Sig   amLODipine  (NORVASC ) 5 MG tablet Take 1 tablet (5 mg total) by mouth daily.   bisoprolol -hydrochlorothiazide  (ZIAC ) 2.5-6.25 MG tablet Take 1 tablet by mouth daily.   simvastatin  (ZOCOR ) 20 MG tablet TAKE 1 TABLET(20 MG) BY MOUTH DAILY AT 6 PM   No facility-administered encounter medications on file as of 12/10/2023.    Surgical History: Past Surgical History:  Procedure Laterality Date   BACK SURGERY  2004    Medical History: Past Medical History:  Diagnosis Date   Hyperlipidemia    Hypertension     Family History: Family History  Problem Relation Age of Onset   Diabetes Mother     Social History   Socioeconomic History   Marital status: Married    Spouse name: Not on file   Number of children: Not on file   Years of education: Not on file   Highest education level: Not on file  Occupational History   Not on file  Tobacco Use   Smoking status: Never   Smokeless tobacco: Never  Substance and Sexual Activity   Alcohol use: No   Drug use: No   Sexual activity: Yes  Other Topics Concern   Not on file  Social History Narrative   Not on file   Social Drivers of Health   Financial Resource Strain: Not on file  Food Insecurity: Not on file  Transportation Needs: Not on file  Physical Activity: Not on file  Stress: Not on file  Social Connections: Unknown (05/18/2021)   Received from [facility]   Social Network    Social Network: Not on file  Intimate Partner Violence: Unknown (05/18/2021)   Received from [facility]   HITS    Physically Hurt: Not on file     Insult or Talk Down To: Not on file    Threaten Physical Harm: Not on file    Scream or Curse: Not on file      Review of Systems  Constitutional:  Negative for fatigue and fever.  HENT:  Negative for congestion, mouth sores and postnasal drip.   Respiratory:  Negative for cough.   Cardiovascular:  Negative for chest pain.  Genitourinary:  Negative for flank pain.  Psychiatric/Behavioral: Negative.      Vital Signs: BP (!) 153/90   Pulse 92   Temp 98 F (36.7 C)   Resp 16   Ht 5' 9 (1.753 m)   Wt 147 lb 4.8 oz (66.8 kg)   SpO2 98%   BMI 21.75 kg/m    Physical Exam Constitutional:      Appearance: Normal appearance.  HENT:     Head: Normocephalic and atraumatic.     Nose: Nose normal.     Mouth/Throat:     Mouth: Mucous membranes are moist.     Pharynx: No posterior oropharyngeal erythema.  Eyes:     Extraocular Movements: Extraocular movements intact.     Pupils: Pupils are equal, round, and reactive to light.  Cardiovascular:     Pulses: Normal pulses.     Heart sounds: Normal heart sounds.  Pulmonary:  Effort: Pulmonary effort is normal.     Breath sounds: Normal breath sounds.  Neurological:     General: No focal deficit present.     Mental Status: He is alert.  Psychiatric:        Mood and Affect: Mood normal.        Behavior: Behavior normal.        Assessment/Plan:   General Counseling: Narvis oakland understanding of the findings of todays visit and agrees with plan of treatment. I have discussed any further diagnostic evaluation that may be needed or ordered today. We also reviewed his medications today. he has been encouraged to call the office with any questions or concerns that should arise related to todays visit.    Orders Placed This Encounter  Procedures   TSH + free T4    Meds ordered this encounter  Medications   bisoprolol -hydrochlorothiazide  (ZIAC ) 2.5-6.25 MG tablet    Sig: Take 1 tablet by mouth daily.    Dispense:   90 tablet    Refill:  2    Total time spent:*** Minutes Time spent includes review of chart, medications, test results, and follow up plan with the patient.   [place] Controlled Substance Database was reviewed by me.   Dr Rilee Knoll M Arleigh Odowd Internal medicine

## 2023-12-18 LAB — TSH+FREE T4
Free T4: 1.06 ng/dL (ref 0.82–1.77)
TSH: 3.51 u[IU]/mL (ref 0.450–4.500)

## 2023-12-19 ENCOUNTER — Ambulatory Visit: Payer: Self-pay | Admitting: Internal Medicine

## 2023-12-19 NOTE — Progress Notes (Signed)
 TSH is normal, will monitor once a year

## 2024-04-27 ENCOUNTER — Ambulatory Visit: Admitting: Nurse Practitioner
